# Patient Record
Sex: Male | Born: 1965 | Race: White | Hispanic: No | Marital: Single | Smoking: Never smoker
Health system: Southern US, Community
[De-identification: ages and names within clinical notes are randomized; demographics above are authoritative.]

## PROBLEM LIST (undated history)

## (undated) DIAGNOSIS — J9819 Other pulmonary collapse: Secondary | ICD-10-CM

## (undated) DIAGNOSIS — IMO0002 Reserved for concepts with insufficient information to code with codable children: Secondary | ICD-10-CM

## (undated) DIAGNOSIS — K219 Gastro-esophageal reflux disease without esophagitis: Secondary | ICD-10-CM

## (undated) DIAGNOSIS — J302 Other seasonal allergic rhinitis: Secondary | ICD-10-CM

## (undated) DIAGNOSIS — N209 Urinary calculus, unspecified: Secondary | ICD-10-CM

## (undated) DIAGNOSIS — M1990 Unspecified osteoarthritis, unspecified site: Secondary | ICD-10-CM

## (undated) DIAGNOSIS — E669 Obesity, unspecified: Secondary | ICD-10-CM

## (undated) DIAGNOSIS — R112 Nausea with vomiting, unspecified: Secondary | ICD-10-CM

## (undated) DIAGNOSIS — Z9889 Other specified postprocedural states: Secondary | ICD-10-CM

## (undated) DIAGNOSIS — Z87442 Personal history of urinary calculi: Secondary | ICD-10-CM

## (undated) DIAGNOSIS — R51 Headache: Secondary | ICD-10-CM

## (undated) DIAGNOSIS — Z0000 Encounter for general adult medical examination without abnormal findings: Secondary | ICD-10-CM

## (undated) DIAGNOSIS — E119 Type 2 diabetes mellitus without complications: Secondary | ICD-10-CM

## (undated) DIAGNOSIS — R3129 Other microscopic hematuria: Secondary | ICD-10-CM

## (undated) DIAGNOSIS — M109 Gout, unspecified: Secondary | ICD-10-CM

## (undated) HISTORY — DX: Obesity, unspecified: E66.9

## (undated) HISTORY — DX: Type 2 diabetes mellitus without complications: E11.9

## (undated) HISTORY — DX: Gout, unspecified: M10.9

## (undated) HISTORY — DX: Headache: R51

## (undated) HISTORY — DX: Reserved for concepts with insufficient information to code with codable children: IMO0002

## (undated) HISTORY — DX: Other microscopic hematuria: R31.29

## (undated) HISTORY — DX: Encounter for general adult medical examination without abnormal findings: Z00.00

## (undated) HISTORY — PX: COLONOSCOPY: SHX174

## (undated) HISTORY — DX: Urinary calculus, unspecified: N20.9

## (undated) HISTORY — PX: CHEST TUBE INSERTION: SHX231

## (undated) HISTORY — DX: Other seasonal allergic rhinitis: J30.2

## (undated) HISTORY — PX: WISDOM TOOTH EXTRACTION: SHX21

---

## 1991-02-05 HISTORY — PX: KNEE SURGERY: SHX244

## 1997-07-15 ENCOUNTER — Ambulatory Visit: Admission: RE | Admit: 1997-07-15 | Discharge: 1997-07-15 | Payer: Self-pay | Admitting: Family Medicine

## 2010-02-04 HISTORY — PX: ELBOW SURGERY: SHX618

## 2010-07-03 ENCOUNTER — Ambulatory Visit (INDEPENDENT_AMBULATORY_CARE_PROVIDER_SITE_OTHER): Payer: BC Managed Care – PPO | Admitting: Internal Medicine

## 2010-07-03 ENCOUNTER — Encounter: Payer: Self-pay | Admitting: Internal Medicine

## 2010-07-03 DIAGNOSIS — E119 Type 2 diabetes mellitus without complications: Secondary | ICD-10-CM

## 2010-07-03 LAB — CBC WITH DIFFERENTIAL/PLATELET
Eosinophils Absolute: 0.2 10*3/uL (ref 0.0–0.7)
Eosinophils Relative: 2.7 % (ref 0.0–5.0)
HCT: 47 % (ref 39.0–52.0)
Lymphs Abs: 3.3 10*3/uL (ref 0.7–4.0)
MCHC: 34.4 g/dL (ref 30.0–36.0)
MCV: 95.2 fl (ref 78.0–100.0)
Monocytes Absolute: 0.5 10*3/uL (ref 0.1–1.0)
Neutrophils Relative %: 55.3 % (ref 43.0–77.0)
Platelets: 251 10*3/uL (ref 150.0–400.0)
RDW: 13.5 % (ref 11.5–14.6)
WBC: 9.1 10*3/uL (ref 4.5–10.5)

## 2010-07-03 LAB — BASIC METABOLIC PANEL
BUN: 14 mg/dL (ref 6–23)
CO2: 29 mEq/L (ref 19–32)
Chloride: 104 mEq/L (ref 96–112)
Creatinine, Ser: 1 mg/dL (ref 0.4–1.5)
Glucose, Bld: 189 mg/dL — ABNORMAL HIGH (ref 70–99)
Potassium: 4.4 mEq/L (ref 3.5–5.1)

## 2010-07-03 LAB — TSH: TSH: 0.59 u[IU]/mL (ref 0.35–5.50)

## 2010-07-03 MED ORDER — GLUCOSE BLOOD VI STRP: ORAL_STRIP | Status: AC

## 2010-07-03 MED ORDER — ONETOUCH ULTRASOFT LANCETS MISC: Status: AC

## 2010-07-03 NOTE — Progress Notes (Signed)
  Subjective:    Patient ID: Nathaniel Luna, male    DOB: 28-Aug-1965, 45 y.o.   MRN: [id number]  HPI New patient, CC is diabetes. 8 years ago, he was told he had borderline diabetes, he  improve his lifestyle and  The DM numbers went back to normal. 4 years ago,  Test showed diabetes again, he lost   followup w/ his previous PCP. 6 weeks ago, he decided to start a new diet, has lost 25 pounds in the last 6 weeks. Last week, he had biometrics done at work, they recommended a home hemoglobin A1c test and it came back 9.0. He is here for confirmation of diabetes and management.  Past Medical History  Diagnosis Date  . Asthma     ? of asthma   . Headache     intense on-off, ibuprofen helps   . Seasonal allergies   . Hearing loss aprox. 2007    low tone decreased , R side, w/u neg per ENT  . Diabetes mellitus   . ACL (anterior cruciate ligament) tear 1992    question of    Past Surgical History  Procedure Date  . Knee surgery 93    carthilage  repair    Family History  Problem Relation Age of Onset  . Arthritis    . Breast cancer Mother   . Prostate cancer Father   . Stroke Mother     M on coumadin  . Aneurysm Father     AAA  . Aneurysm      GF-AAA  . Colon cancer Neg Hx    History   Social History  . Marital Status: Single    Spouse Name: N/A    Number of Children: 0  . Years of Education: N/A   Occupational History  . computer programer     Social History Main Topics  . Smoking status: Never Smoker   . Smokeless tobacco: Not on file  . Alcohol Use: Yes     socially   . Drug Use: No  . Sexually Active: Not on file   Other Topics Concern  . Not on file   Social History Narrative   Pt is a Jehovah Witness, WON'T TAKE TRANSFUSSIONS     Review of Systems Feels well No chest pain or shortness of breath No nausea, vomiting, diarrhea Denies any increased first or urination from baseline    Objective:   Physical Exam  Constitutional: He is oriented to  person, place, and time. He appears well-developed.       Overweight appearing  HENT:  Head: Normocephalic and atraumatic.  Neck: No thyromegaly present.  Cardiovascular: Normal rate, regular rhythm and normal heart sounds.   No murmur heard. Pulmonary/Chest: Effort normal and breath sounds normal. No respiratory distress. He has no wheezes. He has no rales.  Abdominal: Soft. He exhibits no distension. There is no tenderness. There is no rebound.  Musculoskeletal: He exhibits no edema.  Neurological: He is alert and oriented to person, place, and time.          Assessment & Plan:

## 2010-07-03 NOTE — Assessment & Plan Note (Addendum)
See history of present illness, based on the history and recent hemoglobin A1c of 9.0 the patient has diabetes. CBG today around 180. I discussed with patient the diagnosis,  What  the A1c means, diet and exercise. We provided a glucometer and CBG goals discussed. Refer to a nutritionist. Exercise at least 3 hours a week. Labs Come back in 6 weeks. meds if A1C > 9.0, otherwise pt will try diet -exercise

## 2010-07-06 ENCOUNTER — Telehealth: Payer: Self-pay | Admitting: *Deleted

## 2010-07-06 NOTE — Telephone Encounter (Signed)
Message left for patient to return my call.  

## 2010-07-06 NOTE — Telephone Encounter (Signed)
Pt aware of labs and copy mailed.

## 2010-07-06 NOTE — Telephone Encounter (Signed)
Message copied by Leanne Lovely on Fri Jul 06, 2010  9:49 AM ------      Message from: Willow Ora E      Created: Thu Jul 05, 2010  6:27 PM       Advise patient:      His A1c is 9.7, other labs normal.      Recommend to start metformin 500 mg:      Week #1: One tablet daily      Week #2 : One tablet twice a day      Week #3: 1.5 tablets twice a day he understands the goals.      If he develops nausea or diarrhea, he can decrease the dose of metformin and try again a few days later

## 2010-08-14 ENCOUNTER — Encounter: Payer: Self-pay | Admitting: Internal Medicine

## 2010-08-14 ENCOUNTER — Ambulatory Visit (INDEPENDENT_AMBULATORY_CARE_PROVIDER_SITE_OTHER): Payer: BC Managed Care – PPO | Admitting: Internal Medicine

## 2010-08-14 DIAGNOSIS — E119 Type 2 diabetes mellitus without complications: Secondary | ICD-10-CM

## 2010-08-14 MED ORDER — METFORMIN HCL 500 MG PO TABS: ORAL_TABLET | ORAL | Status: DC

## 2010-08-14 NOTE — Assessment & Plan Note (Addendum)
Last A1C 9.7, since then he is doing great w/ life style, has lost additional 15 lb. Decided not to take meds D/w pt DM II mechanism and benefits of taking metformin, I think he will benefit from it Plan: Metformin Cont w/  Healthy life style! See instructions Re-referr to a nutritionist

## 2010-08-14 NOTE — Progress Notes (Signed)
  Subjective:    Patient ID: Nathaniel Luna, male    DOB: 1965-09-26, 45 y.o.   MRN: [id number]  HPI Followup from previous visit. A1c was 9.7, he declined to take metformin as recommended. He is doing a great job with diet and exercise, has decreased 17 additional pounds. Ambulatory blood sugars varies from 140-170. Very rarely are  more than 200. He did not hear about a nutritionist referral  Past Medical History  Diagnosis Date  . Asthma     ? of asthma   . Headache     intense on-off, ibuprofen helps   . Seasonal allergies   . Hearing loss aprox. 2007    low tone decreased , R side, w/u neg per ENT  . Diabetes mellitus   . ACL (anterior cruciate ligament) tear 1992    question of     Past Surgical History  Procedure Date  . Knee surgery 93    carthilage  repair      Review of Systems     Objective:   Physical Exam Alert, oriented, in no apparent distress.       Assessment & Plan:  Today , I spent more than 15 min with the patient, >50% of the time counseling about mechanism of diabetes type 2, benefits of medications such  metformin.

## 2010-08-14 NOTE — Patient Instructions (Signed)
Start metformin 500 mg: Week #1: One tablet daily Week #2 : One tablet twice a day Week #3: 1.5 tablets twice a day  Reduce dose if side effcects SCHEDULE A LAB VISIT IN 2 MONTHS: A1C, BMP, AST,ALT-----DX DM SEE ME IN 4 MONTHS

## 2010-10-03 ENCOUNTER — Ambulatory Visit: Payer: BC Managed Care – PPO | Admitting: *Deleted

## 2010-10-15 ENCOUNTER — Other Ambulatory Visit: Payer: Self-pay | Admitting: Internal Medicine

## 2010-10-15 DIAGNOSIS — E119 Type 2 diabetes mellitus without complications: Secondary | ICD-10-CM

## 2010-10-16 ENCOUNTER — Other Ambulatory Visit (INDEPENDENT_AMBULATORY_CARE_PROVIDER_SITE_OTHER): Payer: BC Managed Care – PPO

## 2010-10-16 DIAGNOSIS — E119 Type 2 diabetes mellitus without complications: Secondary | ICD-10-CM

## 2010-10-16 NOTE — Progress Notes (Signed)
Labs only

## 2010-10-17 LAB — BASIC METABOLIC PANEL
BUN: 14 mg/dL (ref 6–23)
Calcium: 9.2 mg/dL (ref 8.4–10.5)
GFR: 102.89 mL/min (ref >60.00)
Potassium: 4.5 mEq/L (ref 3.5–5.1)
Sodium: 142 mEq/L (ref 135–145)

## 2010-10-17 LAB — AST: AST: 20 U/L (ref 0–37)

## 2010-10-18 ENCOUNTER — Telehealth: Payer: Self-pay

## 2010-10-18 NOTE — Telephone Encounter (Signed)
Message copied by Beverely Low on Thu Oct 18, 2010  4:54 PM ------      Message from: Willow Ora E      Created: Thu Oct 18, 2010  4:52 PM       Advise patient:      His diabetes has improved significantly, very good results, continue with same medications and followup as recommended (by November)

## 2010-10-18 NOTE — Telephone Encounter (Signed)
Left message to notify pt of lab results 

## 2010-12-04 ENCOUNTER — Encounter: Payer: Self-pay | Admitting: Internal Medicine

## 2010-12-04 ENCOUNTER — Ambulatory Visit (INDEPENDENT_AMBULATORY_CARE_PROVIDER_SITE_OTHER): Payer: BC Managed Care – PPO | Admitting: Internal Medicine

## 2010-12-04 DIAGNOSIS — E119 Type 2 diabetes mellitus without complications: Secondary | ICD-10-CM

## 2010-12-04 NOTE — Assessment & Plan Note (Signed)
Doing great, we discussed his A1Cs Encouraged to cont meds and life style modicfication (he thinks is sustainable!) Will RTC for a CPX

## 2010-12-04 NOTE — Patient Instructions (Signed)
Please schedule your CPX

## 2010-12-04 NOTE — Progress Notes (Signed)
  Subjective:    Patient ID: Nathaniel Luna, male    DOB: [birth year], 45 y.o.   MRN: [id number]  HPI ROV Doing great  Past Medical History  Diagnosis Date  . Asthma     ? of asthma   . Headache     intense on-off, ibuprofen helps   . Seasonal allergies   . Hearing loss aprox. 2007    low tone decreased , R side, w/u neg per ENT  . Diabetes mellitus   . ACL (anterior cruciate ligament) tear 1992    question of    Past Surgical History  Procedure Date  . Knee surgery 93    carthilage  repair      Review of Systems Cont with his healthier life style, has lost (per our scales ) 13 pounds in 3 months  good medication compliance and no s/e from meds  No N-V-D    Objective:   Physical Exam  Constitutional: He appears well-developed. No distress.  Cardiovascular: Normal rate, regular rhythm and normal heart sounds.   No murmur heard. Pulmonary/Chest: Effort normal and breath sounds normal. No respiratory distress. He has no wheezes. He has no rales.  Musculoskeletal: He exhibits no edema.  Skin: He is not diaphoretic.          Assessment & Plan:  Declined a flu shot, explained benefits

## 2010-12-25 ENCOUNTER — Encounter: Payer: Self-pay | Admitting: Internal Medicine

## 2010-12-25 ENCOUNTER — Ambulatory Visit (INDEPENDENT_AMBULATORY_CARE_PROVIDER_SITE_OTHER): Payer: BC Managed Care – PPO | Admitting: Internal Medicine

## 2010-12-25 DIAGNOSIS — Z0000 Encounter for general adult medical examination without abnormal findings: Secondary | ICD-10-CM

## 2010-12-25 DIAGNOSIS — Z5181 Encounter for therapeutic drug level monitoring: Secondary | ICD-10-CM

## 2010-12-25 HISTORY — DX: Encounter for general adult medical examination without abnormal findings: Z00.00

## 2010-12-25 LAB — LIPID PANEL
HDL: 41.1 mg/dL (ref >39.00)
Total CHOL/HDL Ratio: 4
VLDL: 19.4 mg/dL (ref 0.0–40.0)

## 2010-12-25 NOTE — Progress Notes (Signed)
  Subjective:    Patient ID: Nathaniel Luna, male    DOB: [birth year], 45 y.o.   MRN: [id number]  HPI Complete physical exam  Past Medical History  Diagnosis Date  . Asthma     ? of asthma   . Headache     intense on-off, ibuprofen helps   . Seasonal allergies   . Hearing loss aprox. 2007    low tone decreased , R side, w/u neg per ENT  . Diabetes mellitus   . ACL (anterior cruciate ligament) tear 1992    question of    Past Surgical History  Procedure Date  . Knee surgery 93    carthilage  repair post injury , R   History   Social History  . Marital Status: Single    Spouse Name: N/A    Number of Children: 0  . Years of Education: N/A   Occupational History  . computer programer     Social History Main Topics  . Smoking status: Never Smoker   . Smokeless tobacco: Never Used  . Alcohol Use: Yes     socially   . Drug Use: No  . Sexually Active: Not on file   Other Topics Concern  . Not on file   Social History Narrative   Pt is a Jehovah Witness, WON'T TAKE TRANSFUSSIONS---Lives by himself---Diet: no change lately , healthier than previous years---Exercise: 1-2 per week   Family History  Problem Relation Age of Onset  . Rheum arthritis Father   . Breast cancer Mother   . Prostate cancer Father 64  . Stroke Mother     M on coumadin  . Aneurysm Father     AAA father dx age 32 aproxand GF  . Colon cancer Neg Hx   . Colon polyps Father     dx in his 24s  . Coronary artery disease Neg Hx   . Diabetes      GF?     Review of Systems No chest pain or shortness of breath No nausea, vomiting, diarrhea or blood in the stools No difficulty urinating or blood in the urine     Objective:   Physical Exam  Constitutional: He is oriented to person, place, and time. He appears well-developed. No distress.  Neck: No thyromegaly present.  Cardiovascular: Normal rate, regular rhythm and normal heart sounds.   No murmur heard. Pulmonary/Chest: Effort normal and  breath sounds normal. No respiratory distress. He has no wheezes. He has no rales.  Abdominal: Soft. Bowel sounds are normal. He exhibits no distension. There is no tenderness. There is no rebound and no guarding.  Musculoskeletal: He exhibits no edema.  Neurological: He is alert and oriented to person, place, and time.  Skin: He is not diaphoretic.  Psychiatric: He has a normal mood and affect. His behavior is normal. Judgment and thought content normal.      Assessment & Plan:

## 2010-12-25 NOTE — Patient Instructions (Addendum)
Call for a lab appointment by the end of December: A1C dx diabetes Diet! Exercise!  next OV in 4 months

## 2010-12-25 NOTE — Assessment & Plan Note (Addendum)
Td 2009 per chart review  Hep B shots 2009 per chart review Flu shot declined, aware of benefits  Never had a cscope , FH colon polyps , discussed cscope vs iFOB. Will do an iFOB this year EKG today wnl Diet exercise discussed  Addendum: He also complained of a paresthesia and a left hand, fourth and fifth finger. Symptoms triggered by putting pressure on the inner elbow. 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

## 2011-01-21 ENCOUNTER — Other Ambulatory Visit: Payer: BC Managed Care – PPO

## 2011-01-21 ENCOUNTER — Other Ambulatory Visit: Payer: Self-pay | Admitting: Internal Medicine

## 2011-01-21 DIAGNOSIS — Z1211 Encounter for screening for malignant neoplasm of colon: Secondary | ICD-10-CM

## 2011-01-21 LAB — FECAL OCCULT BLOOD, IMMUNOCHEMICAL: Fecal Occult Bld: NEGATIVE

## 2011-01-22 ENCOUNTER — Other Ambulatory Visit: Payer: Self-pay | Admitting: Internal Medicine

## 2011-01-22 DIAGNOSIS — E119 Type 2 diabetes mellitus without complications: Secondary | ICD-10-CM

## 2011-01-23 ENCOUNTER — Other Ambulatory Visit (INDEPENDENT_AMBULATORY_CARE_PROVIDER_SITE_OTHER): Payer: BC Managed Care – PPO

## 2011-01-23 ENCOUNTER — Telehealth: Payer: Self-pay | Admitting: Internal Medicine

## 2011-01-23 DIAGNOSIS — G5600 Carpal tunnel syndrome, unspecified upper limb: Secondary | ICD-10-CM

## 2011-01-23 DIAGNOSIS — E119 Type 2 diabetes mellitus without complications: Secondary | ICD-10-CM

## 2011-01-23 LAB — LIPID PANEL
HDL: 38.6 mg/dL — ABNORMAL LOW (ref >39.00)
LDL Cholesterol: 92 mg/dL (ref 0–99)
Total CHOL/HDL Ratio: 4
Triglycerides: 119 mg/dL (ref 0.0–149.0)

## 2011-01-23 LAB — HEMOGLOBIN A1C: Hgb A1c MFr Bld: 6.2 % (ref 4.6–6.5)

## 2011-01-23 NOTE — Telephone Encounter (Signed)
PATIENT WALKED INTO OFFICE THIS MORNING, STATES HE WAS SUPPOSED TO BE REFERRED TO HAND SURGEON FOR CARPAL TUNNEL.  THERE IS NO REFERRAL FOR THIS, WILL YOU PLEASE ENTER?

## 2011-01-23 NOTE — Telephone Encounter (Signed)
See last OV, referral entered

## 2011-01-24 ENCOUNTER — Encounter: Payer: Self-pay | Admitting: *Deleted

## 2011-01-24 ENCOUNTER — Other Ambulatory Visit: Payer: BC Managed Care – PPO

## 2011-01-28 ENCOUNTER — Encounter: Payer: Self-pay | Admitting: *Deleted

## 2011-03-05 ENCOUNTER — Telehealth: Payer: Self-pay | Admitting: Internal Medicine

## 2011-03-05 MED ORDER — METFORMIN HCL 500 MG PO TABS: ORAL_TABLET | ORAL | Status: DC

## 2011-03-05 NOTE — Telephone Encounter (Signed)
Refill done.  

## 2011-03-05 NOTE — Telephone Encounter (Signed)
Patient states that [place] is going out of the [place] for a [place] and needs a refill of metformin sent to [facility] in [place] on [address]

## 2011-04-23 ENCOUNTER — Ambulatory Visit (INDEPENDENT_AMBULATORY_CARE_PROVIDER_SITE_OTHER): Payer: BC Managed Care – PPO | Admitting: Internal Medicine

## 2011-04-23 ENCOUNTER — Encounter: Payer: Self-pay | Admitting: Internal Medicine

## 2011-04-23 DIAGNOSIS — E119 Type 2 diabetes mellitus without complications: Secondary | ICD-10-CM

## 2011-04-23 LAB — BASIC METABOLIC PANEL
BUN: 14 mg/dL (ref 6–23)
CO2: 28 mEq/L (ref 19–32)
Calcium: 9.1 mg/dL (ref 8.4–10.5)
Chloride: 105 mEq/L (ref 96–112)
Creatinine, Ser: 0.8 mg/dL (ref 0.4–1.5)
GFR: 111.71 mL/min (ref >60.00)
Glucose, Bld: 119 mg/dL — ABNORMAL HIGH (ref 70–99)
Potassium: 3.5 mEq/L (ref 3.5–5.1)
Sodium: 142 mEq/L (ref 135–145)

## 2011-04-23 LAB — ALT: ALT: 25 U/L (ref 0–53)

## 2011-04-23 LAB — AST: AST: 19 U/L (ref 0–37)

## 2011-04-23 NOTE — Assessment & Plan Note (Signed)
Normal feet exam see instructions. Labs. Last hemoglobin A1c good and stable

## 2011-04-23 NOTE — Patient Instructions (Signed)
Diabetics: Remember that you need your eyes checked at least once a year to be sure you don't have "retinopathy" As a diabetic patient, you need to be seen every 3 to 6 months on average, keeping  your followups is extremely important to be sure your sugar is well-controlled and you avoid long term complications. Read the note about feet care  --------------------------------------------------------------------------- Schedule an appointment to discuss immunizations

## 2011-04-23 NOTE — Assessment & Plan Note (Signed)
moving to [place]? Will make an appointment to talk about immunizations specifically.

## 2011-04-23 NOTE — Progress Notes (Signed)
  Subjective:    Patient ID: Nathaniel Luna, male    DOB: 01/25/1966, 46 y.o.   MRN: [id number]  HPI ROV Immunizations? Moving to [place] soon, needs to discuss immunization at a future appointment DM-- f/u, good  medication compliance, no recent ambulatory blood sugars.  Past Medical History  Diagnosis Date  . Asthma     ? of asthma   . Headache     intense on-off, ibuprofen helps   . Seasonal allergies   . Hearing loss aprox. 2007    low tone decreased , R side, w/u neg per ENT  . Diabetes mellitus   . ACL (anterior cruciate ligament) tear 1992    question of    SH Moving to [place]?   Review of Systems No chest pain or shortness of breath No nausea, vomiting, diarrhea    Objective:   Physical Exam  Alert oriented x3, no apparent distress. DIABETIC FEET EXAM: No lower extremity edema Normal pedal pulses bilaterally Skin normal and nails are thick Pinprick examination of the feet normal.     Assessment & Plan:

## 2011-04-26 ENCOUNTER — Encounter: Payer: Self-pay | Admitting: Internal Medicine

## 2011-05-07 ENCOUNTER — Ambulatory Visit (INDEPENDENT_AMBULATORY_CARE_PROVIDER_SITE_OTHER): Payer: BC Managed Care – PPO | Admitting: *Deleted

## 2011-05-07 DIAGNOSIS — Z23 Encounter for immunization: Secondary | ICD-10-CM

## 2011-05-10 ENCOUNTER — Telehealth: Payer: Self-pay | Admitting: Internal Medicine

## 2011-05-10 DIAGNOSIS — Z136 Encounter for screening for cardiovascular disorders: Secondary | ICD-10-CM

## 2011-05-10 NOTE — Telephone Encounter (Signed)
Patient called & is requesting referrals for the following AAA SCAN & Colonoscopy  Patient states he has a very high deductible which he has met & it will re-start 5.1.2013 Patient would like to have these things done before then  Patient ph# [phone],  He would like wt Dr Fabienne Bruns

## 2011-05-10 NOTE — Telephone Encounter (Signed)
Patient called back & wants to be referred to Dr Christella Hartigan for colonoscopy

## 2011-05-10 NOTE — Telephone Encounter (Signed)
OK to refer.

## 2011-05-13 NOTE — Telephone Encounter (Signed)
Done

## 2011-05-13 NOTE — Telephone Encounter (Signed)
Arrange a Cscope w/ Dr Christella Hartigan Arrange a Aorta U/S dx screening for AAA, made pt aware if may not be covered by his insurance, he may like to contact them before the procedure

## 2011-05-15 ENCOUNTER — Encounter: Payer: Self-pay | Admitting: Gastroenterology

## 2011-05-16 ENCOUNTER — Ambulatory Visit (INDEPENDENT_AMBULATORY_CARE_PROVIDER_SITE_OTHER): Payer: BC Managed Care – PPO | Admitting: *Deleted

## 2011-05-16 DIAGNOSIS — Z136 Encounter for screening for cardiovascular disorders: Secondary | ICD-10-CM

## 2011-05-20 ENCOUNTER — Ambulatory Visit (INDEPENDENT_AMBULATORY_CARE_PROVIDER_SITE_OTHER): Payer: BC Managed Care – PPO | Admitting: Internal Medicine

## 2011-05-20 ENCOUNTER — Ambulatory Visit (AMBULATORY_SURGERY_CENTER): Payer: BC Managed Care – PPO | Admitting: *Deleted

## 2011-05-20 DIAGNOSIS — Z0000 Encounter for general adult medical examination without abnormal findings: Secondary | ICD-10-CM

## 2011-05-20 NOTE — Progress Notes (Signed)
Pt here for previsit for screening colonoscopy. He states he scheduled procedure as his deductible is met this year and moving to [place] 06/2011. States he is not having any GI problems at this time. No family history of colon cancer or adenomatous polyps. Spoke with Dr. Christella Hartigan who states patient is not due for colonoscopy at this time. Pt states understanding and will present for screening colonoscopy at age 46 or if GI complaints arise.

## 2011-05-21 ENCOUNTER — Ambulatory Visit (INDEPENDENT_AMBULATORY_CARE_PROVIDER_SITE_OTHER): Payer: BC Managed Care – PPO | Admitting: Internal Medicine

## 2011-05-21 ENCOUNTER — Encounter: Payer: Self-pay | Admitting: Internal Medicine

## 2011-05-21 VITALS — BP 132/84 | HR 82 | Temp 97.0°F | Wt 301.0 lb

## 2011-05-21 DIAGNOSIS — Z23 Encounter for immunization: Secondary | ICD-10-CM

## 2011-05-21 DIAGNOSIS — E119 Type 2 diabetes mellitus without complications: Secondary | ICD-10-CM

## 2011-05-21 DIAGNOSIS — Z7189 Other specified counseling: Secondary | ICD-10-CM

## 2011-05-21 DIAGNOSIS — IMO0002 Reserved for concepts with insufficient information to code with codable children: Secondary | ICD-10-CM | POA: Insufficient documentation

## 2011-05-21 HISTORY — DX: Reserved for concepts with insufficient information to code with codable children: IMO0002

## 2011-05-21 NOTE — Assessment & Plan Note (Signed)
See above.  I am dictating a letter in [place] and Spanish that summarize the patient's medical problems.

## 2011-05-21 NOTE — Assessment & Plan Note (Signed)
No change 

## 2011-05-21 NOTE — Patient Instructions (Signed)
You next  a typhoid vaccination booster is next year Your next hepatitis A shot is in 6 months. Good luck!

## 2011-05-21 NOTE — Progress Notes (Signed)
  Subjective:    Patient ID: Nathaniel Luna, male    DOB: 1965-08-08, 46 y.o.   MRN: [id number]  HPI Routine visit. He is here to discuss all his medical problems before he leaves to [place]. In general feeling well.  Past Medical History  Diagnosis Date  . Asthma     ? of asthma   . Headache     intense on-off, ibuprofen helps   . Seasonal allergies   . Hearing loss aprox. 2007    low tone decreased , R side, w/u neg per ENT  . Diabetes mellitus   . ACL (anterior cruciate ligament) tear 1992    question of       Review of Systems Feeling well     Objective:   Physical Exam  A, ox3, nad       Assessment & Plan:   All previous labs were discussed with the patient. He reports that he is updated on all his routine shots. We'll provide a hepatitis A shot today, and needs another one in 6 months Reports that his hepatitis B. series is completed (was done elsewhere) I reminded the patient that malaria and rabies exist  in [place], extreme caution is recommended, to see a local doctor if he is to go to an area of malaria. He reports a typhoid immunization 4 years ago, needs a booster next year. A letter in  [place] and Spanish will be provided to the patient summarizing his medical history.  Today , I spent more than 25  min with the patient, >50% of the time counseling, and /or reviewing the chart and labs

## 2011-05-28 NOTE — Procedures (Unsigned)
DUPLEX ULTRASOUND OF ABDOMINAL AORTA  INDICATION:  Screening for abdominal aortic aneurysm  HISTORY: Diabetes:  Yes Cardiac:  No Hypertension:  No Smoking:  No Connective Tissue Disorder: Family History:  Yes Previous Surgery:  No  DUPLEX EXAM:         AP (cm)                   TRANSVERSE (cm) Proximal             2.9 cm                    2.9 cm Mid                  2.2 cm                    2.2 cm Distal               1.9 cm                    2.0 cm Right Iliac          1.4 cm                    1.3 cm Left Iliac           1.2 cm                    1.3 cm  PREVIOUS:  Date:  AP:  TRANSVERSE:  IMPRESSION: 1. No evidence of aneurysmal dilatation noted in the abdominal aorta. 2. Mildly decreased visualization of the aortoiliac system due to     patient body habitus and overlying bowel gas.  ___________________________________________ V. Charlena Cross, MD  CH/MEDQ  D:  05/17/2011  T:  05/17/2011  Job:  [id number]

## 2011-05-31 ENCOUNTER — Other Ambulatory Visit: Payer: BC Managed Care – PPO | Admitting: Gastroenterology

## 2011-06-03 ENCOUNTER — Telehealth: Payer: Self-pay | Admitting: *Deleted

## 2011-06-03 MED ORDER — METFORMIN HCL 500 MG PO TABS: ORAL_TABLET | ORAL | Status: DC

## 2011-06-03 NOTE — Telephone Encounter (Signed)
Refill done.  

## 2011-06-06 NOTE — Progress Notes (Signed)
  Subjective:    Patient ID: Nathaniel Luna, male    DOB: [birth year], 46 y.o.   MRN: [id number]  HPI No show   Review of Systems     Objective:   Physical Exam        Assessment & Plan:

## 2011-06-20 ENCOUNTER — Encounter: Payer: BC Managed Care – PPO | Admitting: Vascular Surgery

## 2011-07-02 ENCOUNTER — Other Ambulatory Visit: Payer: BC Managed Care – PPO | Admitting: Gastroenterology

## 2013-06-04 ENCOUNTER — Telehealth: Payer: Self-pay

## 2013-06-04 NOTE — Telephone Encounter (Signed)
Medication List and allergies:  Reviewed and updated  90 day supply/mail order: na Local prescriptions: na---get OTC in [place]  Immunizations due: Tdap  A/P:   Updated FH, PSH and Personal Hx  To Discuss with Provider: Not at this time

## 2013-06-07 ENCOUNTER — Encounter: Payer: Self-pay | Admitting: Internal Medicine

## 2013-06-07 ENCOUNTER — Ambulatory Visit (INDEPENDENT_AMBULATORY_CARE_PROVIDER_SITE_OTHER): Payer: BC Managed Care – PPO | Admitting: Internal Medicine

## 2013-06-07 VITALS — BP 126/81 | HR 90 | Temp 98.2°F | Ht 71.4 in | Wt 310.0 lb

## 2013-06-07 DIAGNOSIS — Z0000 Encounter for general adult medical examination without abnormal findings: Secondary | ICD-10-CM

## 2013-06-07 DIAGNOSIS — E119 Type 2 diabetes mellitus without complications: Secondary | ICD-10-CM

## 2013-06-07 NOTE — Assessment & Plan Note (Addendum)
Td 2009   Hep B shots 2009 per chart review Never had a cscope , FH colon polyps , before his insurance declined a cscope, declined to try again Labs Diet and exercise discussed, see diabetes Family history of AAA, patient had ultrasound 05-2011 which was negative for AAA

## 2013-06-07 NOTE — Progress Notes (Signed)
Subjective:    Patient ID: Nathaniel Luna, male    DOB: [birth year], 48 y.o.   MRN: [id number]  DOS:  06/07/2013 Type of  visit:  CPX, pt lives in [place]  ROS Diet-- eat [place] food "I can't tell if is healthy" Exercise-- walks daily  No  CP, SOB Denies  nausea, vomiting diarrhea  Denies  blood in the stools (-) cough, sputum production (-) wheezing, chest congestion No dysuria, gross hematuria, difficulty urinating  No anxiety, depression    Past Medical History  Diagnosis Date  . Asthma     ? of asthma   . Headache(784.0)     intense on-off, ibuprofen helps   . Seasonal allergies   . Hearing loss aprox. 2007    low tone decreased , R side, w/u neg per ENT  . Diabetes mellitus     Past Surgical History  Procedure Laterality Date  . Knee surgery  93    carthilage  repair post injury , R  . Elbow surgery Left 2012    cubital tunnel syndrome     History   Social History  . Marital Status: Single    Spouse Name: N/A    Number of Children: 0  . Years of Education: N/A   Occupational History  . computer programer     Social History Main Topics  . Smoking status: Never Smoker   . Smokeless tobacco: Never Used  . Alcohol Use: Yes     Comment: socially   . Drug Use: No  . Sexual Activity: Not on file   Other Topics Concern  . Not on file   Social History Narrative   Pt is a Fish farm managerJehovah Witness, WON'T TAKE TRANSFUSSIONS    Lives in [place]  Himself          Family History  Problem Relation Age of Onset  . Rheum arthritis Father   . Breast cancer Mother   . Prostate cancer Father [id number]  . Stroke Mother   . AAA (abdominal aortic aneurysm) Father     AAA father dx age 49 aproxand GF  . Colon cancer Neg Hx   . Colon polyps Father     dx in his 7150s  . Coronary artery disease Neg Hx   . Diabetes      GF?  Marland Kitchen. Dementia Mother   . AAA (abdominal aortic aneurysm) Sister        Medication List       This list is accurate as of: 06/07/13  9:00 PM.   Always use your most recent med list.               aspirin 325 MG tablet  Take 325 mg by mouth daily.     glucosamine-chondroitin 500-400 MG tablet  Take 1 tablet by mouth 2 (two) times daily.     metFORMIN 500 MG tablet  Commonly known as:  GLUCOPHAGE  1.5 TABLETS TWICE A DAY     multivitamin tablet  Take 1 tablet by mouth daily.     niacin 500 MG tablet  Take 500 mg by mouth daily with breakfast.           Objective:   Physical Exam BP 126/81  Pulse 90  Temp(Src) 98.2 F (36.8 C)  Ht 5' 11.4" (1.814 m)  Wt 310 lb (140.615 kg)  BMI 42.73 kg/m2  SpO2 95%  General -- alert, well-developed, NAD.  Neck --no thyromegaly   HEENT-- Not pale.  Lungs -- normal respiratory effort, no intercostal retractions, no accessory muscle use, and normal breath sounds.  Heart-- normal rate, regular rhythm, no murmur.  Abdomen-- Not distended, good bowel sounds,soft, non-tender.  Extremities-- no pretibial edema bilaterally  Neurologic--  alert & oriented X3. Speech normal, gait normal, strength normal in all extremities.   Psych-- Cognition and judgment appear intact. Cooperative with normal attention span and concentration. No anxious or depressed appearing.       Assessment & Plan:

## 2013-06-07 NOTE — Progress Notes (Signed)
Pre visit review using our clinic review tool, if applicable. No additional management support is needed unless otherwise documented below in the visit note. 

## 2013-06-07 NOTE — Patient Instructions (Signed)
Please come back fasting:  CMP, CBC, TSH, FLP --- dx V70  A1C, microalbumin --- dx DM  Next visit depending on results, if  all normal-good:  one year

## 2013-06-07 NOTE — Assessment & Plan Note (Signed)
Currently living in [place],  has gained some weight, has difficulty understanding the  nutritional value of the food there. Encouraged to discuss diet w/ a local nutritionists He remains very active . Plan:  Check A1c Continue metformin Followup in one year, if he needs more medications he will have to be seen sooner, [place] usually [place] the [place] every 3 months. Also needs to consider getting a local M.D. In [place] Had eyes checked this AM

## 2013-06-08 ENCOUNTER — Other Ambulatory Visit: Payer: BC Managed Care – PPO

## 2013-06-08 LAB — COMPREHENSIVE METABOLIC PANEL
ALBUMIN: 3.8 g/dL (ref 3.5–5.2)
ALT: 37 U/L (ref 0–53)
AST: 23 U/L (ref 0–37)
Alkaline Phosphatase: 55 U/L (ref 39–117)
BUN: 14 mg/dL (ref 6–23)
CALCIUM: 9.1 mg/dL (ref 8.4–10.5)
CHLORIDE: 108 meq/L (ref 96–112)
CO2: 27 meq/L (ref 19–32)
CREATININE: 1 mg/dL (ref 0.4–1.5)
GFR: 83.38 mL/min (ref >60.00)
Glucose, Bld: 137 mg/dL — ABNORMAL HIGH (ref 70–99)
POTASSIUM: 3.9 meq/L (ref 3.5–5.1)
Sodium: 143 mEq/L (ref 135–145)
TOTAL PROTEIN: 7.3 g/dL (ref 6.0–8.3)
Total Bilirubin: 0.7 mg/dL (ref 0.2–1.2)

## 2013-06-08 LAB — CBC WITH DIFFERENTIAL/PLATELET
BASOS PCT: 0.6 % (ref 0.0–3.0)
Basophils Absolute: 0 10*3/uL (ref 0.0–0.1)
EOS PCT: 4.2 % (ref 0.0–5.0)
Eosinophils Absolute: 0.3 10*3/uL (ref 0.0–0.7)
HCT: 43 % (ref 39.0–52.0)
HEMOGLOBIN: 14.4 g/dL (ref 13.0–17.0)
LYMPHS ABS: 2.4 10*3/uL (ref 0.7–4.0)
Lymphocytes Relative: 30.9 % (ref 12.0–46.0)
MCHC: 33.6 g/dL (ref 30.0–36.0)
MCV: 95 fl (ref 78.0–100.0)
MONO ABS: 0.5 10*3/uL (ref 0.1–1.0)
Monocytes Relative: 6.3 % (ref 3.0–12.0)
NEUTROS ABS: 4.6 10*3/uL (ref 1.4–7.7)
Neutrophils Relative %: 58 % (ref 43.0–77.0)
PLATELETS: 247 10*3/uL (ref 150.0–400.0)
RBC: 4.53 Mil/uL (ref 4.22–5.81)
RDW: 13.5 % (ref 11.5–15.5)
WBC: 7.9 10*3/uL (ref 4.0–10.5)

## 2013-06-08 LAB — LIPID PANEL
CHOL/HDL RATIO: 4
Cholesterol: 142 mg/dL (ref 0–200)
HDL: 34.1 mg/dL — ABNORMAL LOW (ref >39.00)
LDL CALC: 90 mg/dL (ref 0–99)
Triglycerides: 91 mg/dL (ref 0.0–149.0)
VLDL: 18.2 mg/dL (ref 0.0–40.0)

## 2013-06-08 LAB — MICROALBUMIN / CREATININE URINE RATIO
Creatinine,U: 81.8 mg/dL
MICROALB UR: 0.5 mg/dL (ref 0.0–1.9)
Microalb Creat Ratio: 0.6 mg/g (ref 0.0–30.0)

## 2013-06-08 LAB — HEMOGLOBIN A1C: HEMOGLOBIN A1C: 6.8 % — AB (ref 4.6–6.5)

## 2013-06-08 LAB — TSH: TSH: 0.56 u[IU]/mL (ref 0.35–4.50)

## 2013-06-08 NOTE — Addendum Note (Signed)
Addended by: Silvio PateHOMPSON, Vana Arif D on: 06/08/2013 08:54 AM   Modules accepted: Orders

## 2014-06-01 ENCOUNTER — Telehealth: Payer: Self-pay | Admitting: Internal Medicine

## 2014-06-01 NOTE — Telephone Encounter (Signed)
Caller name:Leatham Andrei Relation to ZO:XWRUpt:self Call back number:[phone] Pharmacy:  Reason for call: pt now live in [place] but will be back in [place] the week of July 20-24th, pt would like to get an appt for his CPE. Ok to use to slots for appt?

## 2014-06-01 NOTE — Telephone Encounter (Signed)
Okay to schedule CPE during that time, if needed put 2-15 minute slots together.

## 2014-06-02 NOTE — Telephone Encounter (Signed)
Left message for pt to call and schedule cpe

## 2014-09-15 ENCOUNTER — Telehealth: Payer: Self-pay | Admitting: Internal Medicine

## 2014-09-15 NOTE — Telephone Encounter (Signed)
error 

## 2014-09-16 ENCOUNTER — Ambulatory Visit (INDEPENDENT_AMBULATORY_CARE_PROVIDER_SITE_OTHER): Payer: BLUE CROSS/BLUE SHIELD | Admitting: Internal Medicine

## 2014-09-16 ENCOUNTER — Encounter: Payer: Self-pay | Admitting: Internal Medicine

## 2014-09-16 VITALS — BP 124/86 | HR 94 | Temp 98.3°F | Ht 71.0 in | Wt 319.0 lb

## 2014-09-16 DIAGNOSIS — N200 Calculus of kidney: Secondary | ICD-10-CM

## 2014-09-16 DIAGNOSIS — Z23 Encounter for immunization: Secondary | ICD-10-CM | POA: Diagnosis not present

## 2014-09-16 DIAGNOSIS — E119 Type 2 diabetes mellitus without complications: Secondary | ICD-10-CM

## 2014-09-16 DIAGNOSIS — N209 Urinary calculus, unspecified: Secondary | ICD-10-CM | POA: Insufficient documentation

## 2014-09-16 DIAGNOSIS — Z0000 Encounter for general adult medical examination without abnormal findings: Secondary | ICD-10-CM

## 2014-09-16 DIAGNOSIS — E790 Hyperuricemia without signs of inflammatory arthritis and tophaceous disease: Secondary | ICD-10-CM

## 2014-09-16 NOTE — Assessment & Plan Note (Addendum)
  Had a episode of kidney stones while in [place] 09-2013 approximately, at the time uric acid was elevated and a urologist recommended allopurinol,rowatinex , and 2 OTC. Reports that ultrasound showed 3 or 4 small kidney stones at the time. On further questions, he sometimes has pain and inflammation on his feet, and toes. Gout episodes? Plan: Labs including uric acid. Recommend to see a local doctor [place] in 6 months to monitor his blood test d/t allopurinol use

## 2014-09-16 NOTE — Assessment & Plan Note (Addendum)
Td 2009   Hep B shots 2009 per chart review Pneumonia shot recommended Colon cancer screening: Never had a cscope , FH colon polyps-- rec Cscope, patient states he agrees, will be unable to do it here in [place] but is considering doing it in  [place]  Prostate cancer screening:  DRE normal today, check a PSA Family history of AAA, patient had ultrasound 05-2011 which was negative for AAA Diet and exercise discussed

## 2014-09-16 NOTE — Assessment & Plan Note (Addendum)
Last A1c 6.7, on metformin. Labs Recommend to see an eye doctor yearly  Recommend to see a local M.D. (he lives in [place])  in 4-6 months, pt reluctant, explained that w/ diabetes been seen once a year is not enough and he needs to be monitored closely

## 2014-09-16 NOTE — Patient Instructions (Addendum)
Please get your blood work done at [facility] facility: CMP, CBC, TSH,  PSA FLP, uric acid, HIV, B12, folic acid ---- dx  CPX A1c, microalbumin --- dx  diabetes  It is extremely important that you see  a doctor every 4- 6 months , come back here or see a doctor in [place]  Next visit for a physical in one year

## 2014-09-16 NOTE — Progress Notes (Signed)
Subjective:    Patient ID: Nathaniel Luna, male    DOB: 05-12-1965, 49 y.o.   MRN: [id number]  DOS:  09/16/2014 Type of visit - description : Complete physical exam Interval history: Patient lives in [place], visiting [place], requests a  physical exam. Was diagnosed with kidney stones several months ago.    Review of Systems  Constitutional: No fever. No chills. No unexplained wt changes. No unusual sweats  HEENT: No dental problems, no ear discharge, no facial swelling, no voice changes. No eye discharge, no eye  redness , no  intolerance to light   Respiratory: No wheezing , no  difficulty breathing. No cough , no mucus production  Cardiovascular: No CP, no leg swelling , no  Palpitations  GI: no nausea, no vomiting, no diarrhea , no  abdominal pain.  No blood in the stools. No dysphagia, no odynophagia    Endocrine: No polyphagia, no polyuria , no polydipsia  GU: No dysuria, gross hematuria, difficulty urinating. No urinary urgency, no frequency.  Musculoskeletal: Occasional pain and swelling but no TTP at the ankles and feet joints  Skin: No change in the color of the skin, palor , no  Rash  Allergic, immunologic: No environmental allergies , no  food allergies  Neurological: No dizziness no  syncope. No headaches. No diplopia, no slurred, no slurred speech, no motor deficits, no facial  Numbness  Hematological: No enlarged lymph nodes, no easy bruising , no unusual bleedings  Psychiatry: No suicidal ideas, no hallucinations, no beavior problems, no confusion.  No unusual/severe anxiety, no depression     Past Medical History  Diagnosis Date  . Asthma     ? of asthma   . Headache(784.0)     intense on-off, ibuprofen helps   . Seasonal allergies   . Hearing loss aprox. 2007    low tone decreased , R side, w/u neg per ENT  . Diabetes mellitus   . Urolithiasis     while in [place] 09-2013    Past Surgical History  Procedure Laterality Date  . Knee  surgery  93    carthilage  repair post injury , R  . Elbow surgery Left 2012    cubital tunnel syndrome   . Wisdom tooth extraction      Social History   Social History  . Marital Status: Single    Spouse Name: N/A  . Number of Children: 0  . Years of Education: N/A   Occupational History  . computer programer     Social History Main Topics  . Smoking status: Never Smoker   . Smokeless tobacco: Never Used  . Alcohol Use: Yes     Comment: socially   . Drug Use: No  . Sexual Activity: Not on file   Other Topics Concern  . Not on file   Social History Narrative   Pt is a Jehovah Witness, WON'T TAKE TRANSFUSSIONS    Lives in [place]  Employed by an [employer]   Emergency contact--    father [phone]   Work [phone]       Family History  Problem Relation Age of Onset  . Rheum arthritis Father   . Breast cancer Mother   . Prostate cancer Father 72  . Stroke Mother   . AAA (abdominal aortic aneurysm) Father     AAA father dx age 13 aproxand GF  . Colon cancer Neg Hx   . Colon polyps Father     dx  in his 48s  . Coronary artery disease Neg Hx   . Diabetes      GF?  Marland Kitchen Dementia Mother   . AAA (abdominal aortic aneurysm) Sister        Medication List       This list is accurate as of: 09/16/14 11:59 PM.  Always use your most recent med list.               allopurinol 300 MG tablet  Commonly known as:  ZYLOPRIM  Take 300 mg by mouth daily.     aspirin 325 MG tablet  Take 325 mg by mouth daily.     glucosamine-chondroitin 500-400 MG tablet  Take 1 tablet by mouth 2 (two) times daily.     metFORMIN 500 MG tablet  Commonly known as:  GLUCOPHAGE  1.5 TABLETS TWICE A DAY     multivitamin tablet  Take 1 tablet by mouth daily.     niacin 500 MG tablet  Take 500 mg by mouth daily with breakfast.           Objective:   Physical Exam BP 124/86 mmHg  Pulse 94  Temp(Src) 98.3 F (36.8 C) (Oral)  Ht 5\' 11"  (1.803 m)  Wt 319 lb (144.697  kg)  BMI 44.51 kg/m2  SpO2 98% General:   Well developed, well nourished . NAD.  Neck:  Full range of motion. Supple. No  thyromegaly , normal carotid pulse HEENT:  Normocephalic . Face symmetric, atraumatic Lungs:  CTA B Normal respiratory effort, no intercostal retractions, no accessory muscle use. Heart: RRR,  no murmur.  No pretibial edema bilaterally  Abdomen:  Not distended, soft, non-tender. No rebound or rigidity. No mass,organomegaly Rectal:  External abnormalities: none. Normal sphincter tone. No rectal masses or tenderness.  Stool brown  Prostate: Prostate gland firm and smooth, no enlargement, nodularity, tenderness, mass, asymmetry or induration.  Skin: Exposed areas without rash. Not pale. Not jaundice Neurologic:  alert & oriented X3.  Speech normal, gait appropriate for age and unassisted Strength symmetric and appropriate for age.  Psych: Cognition and judgment appear intact.  Cooperative with normal attention span and concentration.  Behavior appropriate. No anxious or depressed appearing.    Assessment & Plan:

## 2014-09-16 NOTE — Progress Notes (Signed)
Pre visit review using our clinic review tool, if applicable. No additional management support is needed unless otherwise documented below in the visit note. 

## 2014-09-18 NOTE — Assessment & Plan Note (Signed)
Had a episode of kidney stones while in [place] 09-2013 approximately, at the time uric acid was elevated and a urologist recommended allopurinol,rowatinex , and 2 OTC. Reports that ultrasound showed 3 or 4 small kidney stones at the time. On further questions, he sometimes has pain and inflammation on his feet, and toes. Gout episodes? Plan: Labs including uric acid. Recommend to see a local doctor [place] in 6 months to monitor his blood test d/t allopurinol use

## 2014-09-22 ENCOUNTER — Other Ambulatory Visit (INDEPENDENT_AMBULATORY_CARE_PROVIDER_SITE_OTHER): Payer: BLUE CROSS/BLUE SHIELD

## 2014-09-22 DIAGNOSIS — E790 Hyperuricemia without signs of inflammatory arthritis and tophaceous disease: Secondary | ICD-10-CM

## 2014-09-22 DIAGNOSIS — Z0000 Encounter for general adult medical examination without abnormal findings: Secondary | ICD-10-CM | POA: Diagnosis not present

## 2014-09-22 DIAGNOSIS — E119 Type 2 diabetes mellitus without complications: Secondary | ICD-10-CM

## 2014-09-22 LAB — LIPID PANEL
CHOLESTEROL: 145 mg/dL (ref 0–200)
HDL: 36.6 mg/dL — ABNORMAL LOW (ref >39.00)
LDL Cholesterol: 88 mg/dL (ref 0–99)
NonHDL: 108.09
Total CHOL/HDL Ratio: 4
Triglycerides: 99 mg/dL (ref 0.0–149.0)
VLDL: 19.8 mg/dL (ref 0.0–40.0)

## 2014-09-22 LAB — CBC WITH DIFFERENTIAL/PLATELET
BASOS ABS: 0.1 10*3/uL (ref 0.0–0.1)
Basophils Relative: 0.8 % (ref 0.0–3.0)
EOS ABS: 0.3 10*3/uL (ref 0.0–0.7)
Eosinophils Relative: 4.1 % (ref 0.0–5.0)
HEMATOCRIT: 45 % (ref 39.0–52.0)
HEMOGLOBIN: 15.2 g/dL (ref 13.0–17.0)
LYMPHS PCT: 32.9 % (ref 12.0–46.0)
Lymphs Abs: 2.4 10*3/uL (ref 0.7–4.0)
MCHC: 33.8 g/dL (ref 30.0–36.0)
MCV: 94.5 fl (ref 78.0–100.0)
Monocytes Absolute: 0.5 10*3/uL (ref 0.1–1.0)
Monocytes Relative: 6.5 % (ref 3.0–12.0)
Neutro Abs: 4.1 10*3/uL (ref 1.4–7.7)
Neutrophils Relative %: 55.7 % (ref 43.0–77.0)
PLATELETS: 279 10*3/uL (ref 150.0–400.0)
RBC: 4.77 Mil/uL (ref 4.22–5.81)
RDW: 13.5 % (ref 11.5–15.5)
WBC: 7.4 10*3/uL (ref 4.0–10.5)

## 2014-09-22 LAB — COMPREHENSIVE METABOLIC PANEL
ALBUMIN: 4 g/dL (ref 3.5–5.2)
ALK PHOS: 68 U/L (ref 39–117)
ALT: 47 U/L (ref 0–53)
AST: 32 U/L (ref 0–37)
BUN: 18 mg/dL (ref 6–23)
CO2: 29 mEq/L (ref 19–32)
CREATININE: 0.9 mg/dL (ref 0.40–1.50)
Calcium: 9.4 mg/dL (ref 8.4–10.5)
Chloride: 103 mEq/L (ref 96–112)
GFR: 94.75 mL/min (ref >60.00)
Glucose, Bld: 166 mg/dL — ABNORMAL HIGH (ref 70–99)
Potassium: 4.2 mEq/L (ref 3.5–5.1)
SODIUM: 141 meq/L (ref 135–145)
TOTAL PROTEIN: 7.4 g/dL (ref 6.0–8.3)
Total Bilirubin: 0.8 mg/dL (ref 0.2–1.2)

## 2014-09-22 LAB — MICROALBUMIN / CREATININE URINE RATIO
CREATININE, U: 92.8 mg/dL
MICROALB/CREAT RATIO: 0.8 mg/g (ref 0.0–30.0)
Microalb, Ur: 0.7 mg/dL (ref 0.0–1.9)

## 2014-09-22 LAB — PSA: PSA: 0.63 ng/mL (ref 0.10–4.00)

## 2014-09-22 LAB — FOLATE: Folate: >24.8 ng/mL (ref >5.9)

## 2014-09-22 LAB — URIC ACID: Uric Acid, Serum: 9 mg/dL — ABNORMAL HIGH (ref 4.0–7.8)

## 2014-09-22 LAB — HEMOGLOBIN A1C: HEMOGLOBIN A1C: 7.7 % — AB (ref 4.6–6.5)

## 2014-09-22 LAB — TSH: TSH: 1.01 u[IU]/mL (ref 0.35–4.50)

## 2014-09-22 LAB — VITAMIN B12: Vitamin B-12: 430 pg/mL (ref 211–911)

## 2014-09-23 LAB — HIV ANTIBODY (ROUTINE TESTING W REFLEX): HIV: NONREACTIVE

## 2014-09-24 ENCOUNTER — Encounter: Payer: Self-pay | Admitting: Internal Medicine

## 2014-09-26 ENCOUNTER — Telehealth: Payer: Self-pay | Admitting: Internal Medicine

## 2014-09-26 ENCOUNTER — Other Ambulatory Visit: Payer: Self-pay | Admitting: Internal Medicine

## 2014-09-26 MED ORDER — FEBUXOSTAT 40 MG PO TABS: 40.0000 mg | ORAL_TABLET | Freq: Every day | ORAL | Status: DC

## 2014-09-26 MED ORDER — METFORMIN HCL 1000 MG PO TABS: 1000.0000 mg | ORAL_TABLET | Freq: Two times a day (BID) | ORAL | Status: DC

## 2014-09-26 MED ORDER — METFORMIN HCL 1000 MG PO TABS: 1000.0000 mg | ORAL_TABLET | Freq: Two times a day (BID) | ORAL | Status: AC

## 2014-09-26 NOTE — Telephone Encounter (Signed)
Stop allopurinol, start uloric 40 mg 1 po qd , rx sent

## 2014-09-26 NOTE — Telephone Encounter (Signed)
Pt would like to have the rx go to a different pharmacy then on file. He [facility] [address].

## 2014-09-26 NOTE — Telephone Encounter (Signed)
[facility] [place] [facility] metformin Rx. [facility] to [facility].  Pt now able to view results and instructions via mychart. States that he would like to proceed with new Rx of Uloric per PCP recommendation.  Please advise dose / directions and I can send Rx?

## 2014-09-26 NOTE — Telephone Encounter (Signed)
Caller name: Delwin Raczkowski  Relationship to patient: Self  Can be reached: [phone]  Pharmacy:  Reason for call: pt says that he left a message on mychart for Dr. Rock Nephew needs a refill on his metfromin Rx and a new Rx for Allopurinol. Pt also says that he didn't get his results via mychart for his labs. Pt request to speak with you directly.

## 2014-09-26 NOTE — Telephone Encounter (Signed)
Notified pt of PA process and we will let him know once it has been approved. He voices understanding.

## 2014-09-26 NOTE — Telephone Encounter (Signed)
PA for uloric initiated. Awaiting determination. JG//CMA

## 2014-09-29 NOTE — Telephone Encounter (Signed)
PA approved effective from 09/26/2014 through 02/03/2038

## 2014-11-25 ENCOUNTER — Encounter: Payer: Self-pay | Admitting: Emergency Medicine

## 2014-11-25 ENCOUNTER — Emergency Department: Payer: BLUE CROSS/BLUE SHIELD

## 2014-11-25 ENCOUNTER — Emergency Department
Admission: EM | Admit: 2014-11-25 | Discharge: 2014-11-25 | Disposition: A | Discharge status: Home / Self Care | Payer: BLUE CROSS/BLUE SHIELD | Attending: Emergency Medicine | Admitting: Emergency Medicine

## 2014-11-25 DIAGNOSIS — R109 Unspecified abdominal pain: Secondary | ICD-10-CM | POA: Diagnosis present

## 2014-11-25 DIAGNOSIS — Z79899 Other long term (current) drug therapy: Secondary | ICD-10-CM | POA: Insufficient documentation

## 2014-11-25 DIAGNOSIS — E119 Type 2 diabetes mellitus without complications: Secondary | ICD-10-CM | POA: Insufficient documentation

## 2014-11-25 DIAGNOSIS — E669 Obesity, unspecified: Secondary | ICD-10-CM | POA: Insufficient documentation

## 2014-11-25 DIAGNOSIS — Z7982 Long term (current) use of aspirin: Secondary | ICD-10-CM | POA: Insufficient documentation

## 2014-11-25 DIAGNOSIS — N200 Calculus of kidney: Secondary | ICD-10-CM | POA: Insufficient documentation

## 2014-11-25 LAB — COMPREHENSIVE METABOLIC PANEL
ALT: 31 U/L (ref 17–63)
AST: 32 U/L (ref 15–41)
Albumin: 3.7 g/dL (ref 3.5–5.0)
Alkaline Phosphatase: 62 U/L (ref 38–126)
Anion gap: 7 (ref 5–15)
BILIRUBIN TOTAL: 1.6 mg/dL — AB (ref 0.3–1.2)
BUN: 25 mg/dL — AB (ref 6–20)
CALCIUM: 8.5 mg/dL — AB (ref 8.9–10.3)
CHLORIDE: 101 mmol/L (ref 101–111)
CO2: 28 mmol/L (ref 22–32)
CREATININE: 1.35 mg/dL — AB (ref 0.61–1.24)
GFR, EST NON AFRICAN AMERICAN: 60 mL/min — AB (ref >60)
GLUCOSE: 191 mg/dL — AB (ref 65–99)
Potassium: 4.5 mmol/L (ref 3.5–5.1)
Sodium: 136 mmol/L (ref 135–145)
Total Protein: 7.1 g/dL (ref 6.5–8.1)

## 2014-11-25 LAB — URINALYSIS COMPLETE WITH MICROSCOPIC (ARMC ONLY)
BILIRUBIN URINE: NEGATIVE
Bacteria, UA: NONE SEEN
GLUCOSE, UA: NEGATIVE mg/dL
Leukocytes, UA: NEGATIVE
Nitrite: NEGATIVE
PH: 6 (ref 5.0–8.0)
Protein, ur: NEGATIVE mg/dL
SPECIFIC GRAVITY, URINE: 1.016 (ref 1.005–1.030)

## 2014-11-25 LAB — CBC
HCT: 43.2 % (ref 40.0–52.0)
Hemoglobin: 14.7 g/dL (ref 13.0–18.0)
MCH: 31.6 pg (ref 26.0–34.0)
MCHC: 34 g/dL (ref 32.0–36.0)
MCV: 92.7 fL (ref 80.0–100.0)
PLATELETS: 234 10*3/uL (ref 150–440)
RBC: 4.66 MIL/uL (ref 4.40–5.90)
RDW: 13.7 % (ref 11.5–14.5)
WBC: 14.8 10*3/uL — ABNORMAL HIGH (ref 3.8–10.6)

## 2014-11-25 MED ORDER — SODIUM CHLORIDE 0.9 % IV BOLUS (SEPSIS)
1000.0000 mL | Freq: Once | INTRAVENOUS | Status: AC
  Administered 2014-11-25: 1000 mL via INTRAVENOUS

## 2014-11-25 MED ORDER — ETODOLAC 500 MG PO TABS: 500.0000 mg | ORAL_TABLET | Freq: Two times a day (BID) | ORAL | Status: DC | PRN

## 2014-11-25 MED ORDER — ONDANSETRON 4 MG PO TBDP: 4.0000 mg | ORAL_TABLET | Freq: Four times a day (QID) | ORAL | Status: DC | PRN

## 2014-11-25 MED ORDER — TAMSULOSIN HCL 0.4 MG PO CAPS: 0.4000 mg | ORAL_CAPSULE | Freq: Every day | ORAL | Status: DC

## 2014-11-25 MED ORDER — HYDROCODONE-ACETAMINOPHEN 5-325 MG PO TABS: 1.0000 | ORAL_TABLET | Freq: Four times a day (QID) | ORAL | Status: DC | PRN

## 2014-11-25 MED ORDER — ONDANSETRON HCL 4 MG/2ML IJ SOLN
4.0000 mg | Freq: Once | INTRAMUSCULAR | Status: AC
  Administered 2014-11-25: 4 mg via INTRAVENOUS
  Filled 2014-11-25: qty 2

## 2014-11-25 MED ORDER — KETOROLAC TROMETHAMINE 30 MG/ML IJ SOLN
30.0000 mg | Freq: Once | INTRAMUSCULAR | Status: AC
  Administered 2014-11-25: 30 mg via INTRAVENOUS
  Filled 2014-11-25: qty 1

## 2014-11-25 NOTE — ED Notes (Signed)
Pt with right side flank pain stated yesteday, hx of kidney stones. No blood in urine and able to pass urine freely.

## 2014-11-25 NOTE — ED Provider Notes (Signed)
[facility] Emergency Department Provider Note REMINDER - THIS NOTE IS NOT A FINAL MEDICAL RECORD UNTIL IT IS SIGNED. UNTIL THEN, THE CONTENT BELOW MAY REFLECT INFORMATION FROM A DOCUMENTATION TEMPLATE, NOT THE ACTUAL PATIENT VISIT. ____________________________________________  Time seen: Approximately 1:20 PM  I have reviewed the triage vital signs and the nursing notes.   HISTORY  Chief Complaint Flank Pain    HPI Nathaniel Luna is a 49 y.o. male reports a previous history of kidney stones and diabetes. Patient reports yesterday woke up with severe right flank pain that feels like similar kidney stones for which she was told he has "6" in the past. He's never had a previous surgeries in detail the past all of his previous stones. He reports yesterday is quite nauseated and vomited a couple of times due to pain. He has not vomited today but also reports he does not want to eat.  He denies pain in the right lower quadrant, no pain in the testes or groin. Never any previous abdominal surgeries. Of note the patient does live in [place] where he has a urologist whom he follows with.  Sharp severe pain in the right flank.  Past Medical History  Diagnosis Date  . Asthma     ? of asthma   . Headache(784.0)     intense on-off, ibuprofen helps   . Seasonal allergies   . Hearing loss aprox. 2007    low tone decreased , R side, w/u neg per ENT  . Diabetes mellitus   . Urolithiasis     while in [place] 09-2013    Patient Active Problem List   Diagnosis Date Noted  . Urolithiasis -- was rx allopurinol 09/16/2014  . Elevated uric acid in blood 09/16/2014  . Advice or immunization for travel 05/21/2011  . General medical examination 12/25/2010  . Diabetes mellitus [facility])     Past Surgical History  Procedure Laterality Date  . Knee surgery  93    carthilage  repair post injury , R  . Elbow surgery Left 2012    cubital tunnel syndrome   . Wisdom tooth  extraction      Current Outpatient Rx  Name  Route  Sig  Dispense  Refill  . aspirin 325 MG tablet   Oral   Take 325 mg by mouth daily.           Marland Kitchen etodolac (LODINE) 500 MG tablet   Oral   Take 1 tablet (500 mg total) by mouth 2 (two) times daily as needed.   10 tablet   0   . febuxostat (ULORIC) 40 MG tablet   Oral   Take 1 tablet (40 mg total) by mouth daily.   90 tablet   0   . glucosamine-chondroitin 500-400 MG tablet   Oral   Take 1 tablet by mouth 2 (two) times daily.           Marland Kitchen HYDROcodone-acetaminophen (NORCO/VICODIN) 5-325 MG tablet   Oral   Take 1 tablet by mouth every 6 (six) hours as needed for moderate pain.   15 tablet   0   . metFORMIN (GLUCOPHAGE) 1000 MG tablet   Oral   Take 1 tablet (1,000 mg total) by mouth 2 (two) times daily with a meal.   180 tablet   1   . Multiple Vitamin (MULTIVITAMIN) tablet   Oral   Take 1 tablet by mouth daily.           Marland Kitchen  niacin 500 MG tablet   Oral   Take 500 mg by mouth daily with breakfast.           . ondansetron (ZOFRAN ODT) 4 MG disintegrating tablet   Oral   Take 1 tablet (4 mg total) by mouth every 6 (six) hours as needed for nausea or vomiting.   20 tablet   0   . tamsulosin (FLOMAX) 0.4 MG CAPS capsule   Oral   Take 1 capsule (0.4 mg total) by mouth daily.   7 capsule   0     Allergies Codeine  Family History  Problem Relation Age of Onset  . Rheum arthritis Father   . Breast cancer Mother   . Prostate cancer Father 87  . Stroke Mother   . AAA (abdominal aortic aneurysm) Father     AAA father dx age 74 aproxand GF  . Colon cancer Neg Hx   . Colon polyps Father     dx in his 53s  . Coronary artery disease Neg Hx   . Diabetes      GF?  Marland Kitchen Dementia Mother   . AAA (abdominal aortic aneurysm) Sister     Social History Social History  Substance Use Topics  . Smoking status: Never Smoker   . Smokeless tobacco: Never Used  . Alcohol Use: Yes     Comment: socially     Review  of Systems Constitutional: No fever/chills Eyes: No visual changes. ENT: No sore throat. Cardiovascular: Denies chest pain. Respiratory: Denies shortness of breath. Gastrointestinal:  No diarrhea.  No constipation. Genitourinary: Negative for dysuria. Urine has been dark, possibly blood in it. Musculoskeletal: Negative for back pain. Skin: Negative for rash. Neurological: Negative for headaches, focal weakness or numbness.  10-point ROS otherwise negative.  ____________________________________________   PHYSICAL EXAM:  VITAL SIGNS: ED Triage Vitals  Enc Vitals Group     BP 11/25/14 1110 134/60 mmHg     Pulse Rate 11/25/14 1109 69     Resp 11/25/14 1109 20     Temp 11/25/14 1109 98.3 F (36.8 C)     Temp src --      SpO2 11/25/14 1109 97 %     Weight 11/25/14 1109 320 lb (145.151 kg)     Height 11/25/14 1109 6' (1.829 m)     Head Cir --      Peak Flow --      Pain Score 11/25/14 1109 8     Pain Loc --      Pain Edu? --      Excl. in GC? --    Constitutional: Alert and oriented. Well appearing and in moderate painful distress holding his right flank unable to position himself comfortably in the bed. Patient is markedly obese. Eyes: Conjunctivae are normal. PERRL. EOMI. Head: Atraumatic. Nose: No congestion/rhinnorhea. Mouth/Throat: Mucous membranes are moist.  Oropharynx non-erythematous. Neck: No stridor.   Cardiovascular: Normal rate, regular rhythm. Grossly normal heart sounds.  Good peripheral circulation. Respiratory: Normal respiratory effort.  No retractions. Lungs CTAB. Gastrointestinal: Soft and nontender. No distention. No abdominal bruits. Moderate right-sided CVA tenderness. Musculoskeletal: No lower extremity tenderness nor edema.  No joint effusions. Neurologic:  Normal speech and language. No gross focal neurologic deficits are appreciated. No gait instability. Skin:  Skin is warm, dry and intact. No rash noted. Psychiatric: Mood and affect are normal.  Speech and behavior are normal.  ____________________________________________   LABS (all labs ordered are listed, but only abnormal results are displayed)  Labs Reviewed  URINALYSIS COMPLETEWITH MICROSCOPIC ([facility] ONLY) - Abnormal; Notable for the following:    Color, Urine YELLOW (*)    APPearance CLEAR (*)    Ketones, ur TRACE (*)    Hgb urine dipstick 1+ (*)    Squamous Epithelial / LPF 0-5 (*)    All other components within normal limits  COMPREHENSIVE METABOLIC PANEL  CBC   ____________________________________________  EKG   ____________________________________________  RADIOLOGY  CT RENAL STONE STUDY (Final result) Result time: 11/25/14 14:06:19   Final result by Rad Results In Interface (11/25/14 14:06:19)   Narrative:   CLINICAL DATA: Right-sided flank pain for 2 days  EXAM: CT ABDOMEN AND PELVIS WITHOUT CONTRAST  TECHNIQUE: Multidetector CT imaging of the abdomen and pelvis was performed following the standard protocol without IV contrast.  COMPARISON: None.  FINDINGS: Lung bases are free of acute infiltrate or sizable effusion. The liver is fatty infiltrated. The spleen, adrenal glands, pancreas and gallbladder are within normal limits. The left kidney demonstrates a tiny 1 mm nonobstructing stone in the upper pole. The left ureter is within normal limits. On the right, the kidney shows hydronephrosis and proximal hydroureter secondary to a 9 mm stone just below the ureteral pelvic junction. Additionally a second 7 mm stone is noted in mid to lower pole of the right kidney.  The bladder is well distended. Prostatic calcifications are seen. The appendix is within normal limits. No acute bony abnormality is noted.  IMPRESSION: 9 mm proximal right ureteral stone with obstructive changes.  Bilateral nonobstructing stones as described.     ____________________________________________   PROCEDURES  Procedure(s) performed: None  Critical  Care performed: No  ____________________________________________   INITIAL IMPRESSION / ASSESSMENT AND PLAN / ED COURSE  Pertinent labs & imaging results that were available during my care of the patient were reviewed by me and considered in my medical decision making (see chart for details).  Patient presents with sudden onset right flank pain associated nausea and vomiting with possible hematuria yesterday. His clinical history and exam seem most consistent with possible recurrent kidney stone, but other etiologies on the right including appendicitis and cholecystitis would be considered. He has no evidence of peritonitis or focal abnormality to suggest acute intra-abdominal surgical process or infection, but certainly does need pain control. I will obtain CT imaging to further evaluate for the cause of his pain, we'll check labs, and obtain pain control with Toradol.  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  D/W [place]. Recommends outpatient, elective management. Pain control, floxmax, outpatient follow-up within 1 week. Patient agreeabel and return precautions advised. Friend driving.  Ongoing care and disposition assigned to Dr. Mayford KnifeWilliams who will follow-up on CBC and comp metabolic panel result. Should these not show concerning abnormality, plan is to discharge the patient home to follow up closely with urology. ____________________________________________   FINAL CLINICAL IMPRESSION(S) / ED DIAGNOSES  Final diagnoses:  Right flank discomfort  Kidney stone on right side      Sharyn CreamerMark Jackline Castilla, MD 11/25/14 1539

## 2014-11-25 NOTE — Discharge Instructions (Signed)
You have been seen in the Emergency Department (ED) today for pain that we believe based on your workup, is caused by kidney stones.  As we have discussed, please drink plenty of fluids.  Please make a follow up appointment with the physician(s) listed elsewhere in this documentation.  You may take pain medication as needed but ONLY as prescribed.    Please see your doctor as soon as possible as stones may take 1-3 weeks to pass and you may require additional care or medications.  Do not drink alcohol, drive or participate in any other potentially dangerous activities while taking opiate pain medication as it may make you sleepy. Do not take this medication with any other sedating medications, either prescription or over-the-counter. If you were prescribed Percocet or Vicodin, do not take these with acetaminophen (Tylenol) as it is already contained within these medications.   This medication is an opiate (or narcotic) pain medication and can be habit forming.  Use it as little as possible to achieve adequate pain control.  Do not use or use it with extreme caution if you have a history of opiate abuse or dependence.  If you are on a pain contract with your primary care doctor or a pain specialist, be sure to let them know you were prescribed this medication today from the [facility] Emergency Department.  This medication is intended for your use only - do not give any to anyone else and keep it in a secure place where nobody else, especially children, have access to it.  It will also cause or worsen constipation, so you may want to consider taking an over-the-counter stool softener while you are taking this medication.  Return to the Emergency Department (ED) or call your doctor if you have any worsening pain, fever, painful urination, are unable to urinate, or develop other symptoms that concern you.   Kidney Stones Kidney stones (urolithiasis) are deposits that form inside your kidneys. The  intense pain is caused by the stone moving through the urinary tract. When the stone moves, the ureter goes into spasm around the stone. The stone is usually passed in the urine.  CAUSES   A disorder that makes certain neck glands produce too much parathyroid hormone (primary hyperparathyroidism).  A buildup of uric acid crystals, similar to gout in your joints.  Narrowing (stricture) of the ureter.  A kidney obstruction present at birth (congenital obstruction).  Previous surgery on the kidney or ureters.  Numerous kidney infections. SYMPTOMS   Feeling sick to your stomach (nauseous).  Throwing up (vomiting).  Blood in the urine (hematuria).  Pain that usually spreads (radiates) to the groin.  Frequency or urgency of urination. DIAGNOSIS   Taking a history and physical exam.  Blood or urine tests.  CT scan.  Occasionally, an examination of the inside of the urinary bladder (cystoscopy) is performed. TREATMENT   Observation.  Increasing your fluid intake.  Extracorporeal shock wave lithotripsy--This is a noninvasive procedure that uses shock waves to break up kidney stones.  Surgery may be needed if you have severe pain or persistent obstruction. There are various surgical procedures. Most of the procedures are performed with the use of small instruments. Only small incisions are needed to accommodate these instruments, so recovery time is minimized. The size, location, and chemical composition are all important variables that will determine the proper choice of action for you. Talk to your health care provider to better understand your situation so that you will minimize the  risk of injury to yourself and your kidney.  HOME CARE INSTRUCTIONS   Drink enough water and fluids to keep your urine clear or pale yellow. This will help you to pass the stone or stone fragments.  Strain all urine through the provided strainer. Keep all particulate matter and stones for your  health care provider to see. The stone causing the pain may be as small as a grain of salt. It is very important to use the strainer each and every time you pass your urine. The collection of your stone will allow your health care provider to analyze it and verify that a stone has actually passed. The stone analysis will often identify what you can do to reduce the incidence of recurrences.  Only take over-the-counter or prescription medicines for pain, discomfort, or fever as directed by your health care provider.  Keep all follow-up visits as told by your health care provider. This is important.  Get follow-up X-rays if required. The absence of pain does not always mean that the stone has passed. It may have only stopped moving. If the urine remains completely obstructed, it can cause loss of kidney function or even complete destruction of the kidney. It is your responsibility to make sure X-rays and follow-ups are completed. Ultrasounds of the kidney can show blockages and the status of the kidney. Ultrasounds are not associated with any radiation and can be performed easily in a matter of minutes.  Make changes to your daily diet as told by your health care provider. You may be told to:  Limit the amount of salt that you eat.  Eat 5 or more servings of fruits and vegetables each day.  Limit the amount of meat, poultry, fish, and eggs that you eat.  Collect a 24-hour urine sample as told by your health care provider.You may need to collect another urine sample every 6-12 months. SEEK MEDICAL CARE IF:  You experience pain that is progressive and unresponsive to any pain medicine you have been prescribed. SEEK IMMEDIATE MEDICAL CARE IF:   Pain cannot be controlled with the prescribed medicine.  You have a fever or shaking chills.  The severity or intensity of pain increases over 18 hours and is not relieved by pain medicine.  You develop a new onset of abdominal pain.  You feel faint  or pass out.  You are unable to urinate.   This information is not intended to replace advice given to you by your health care provider. Make sure you discuss any questions you have with your health care provider.   Document Released: 01/21/2005 Document Revised: 10/12/2014 Document Reviewed: 06/24/2012 Elsevier Interactive Patient Education [employer]2016 Elsevier Inc.

## 2014-11-25 NOTE — ED Provider Notes (Signed)
Labs Reviewed  URINALYSIS COMPLETEWITH MICROSCOPIC ([facility] ONLY) - Abnormal; Notable for the following:    Color, Urine YELLOW (*)    APPearance CLEAR (*)    Ketones, ur TRACE (*)    Hgb urine dipstick 1+ (*)    Squamous Epithelial / LPF 0-5 (*)    All other components within normal limits  COMPREHENSIVE METABOLIC PANEL - Abnormal; Notable for the following:    Glucose, Bld 191 (*)    BUN 25 (*)    Creatinine, Ser 1.35 (*)    Calcium 8.5 (*)    Total Bilirubin 1.6 (*)    GFR calc non Af Amer 60 (*)    All other components within normal limits  CBC - Abnormal; Notable for the following:    WBC 14.8 (*)    All other components within normal limits   Patient has been cleared for discharge, labs are grossly unremarkable. Patient did receive a liter of saline. We'll refer to palpation as previously planned by Dr. Fanny BienQuale.   Emily FilbertJonathan E Chrisie Jankovich, MD 11/25/14 [id number]

## 2014-11-28 ENCOUNTER — Telehealth: Payer: Self-pay | Admitting: Internal Medicine

## 2014-11-28 MED ORDER — ALLOPURINOL 300 MG PO TABS: 300.0000 mg | ORAL_TABLET | Freq: Every day | ORAL | Status: AC

## 2014-11-28 NOTE — Telephone Encounter (Signed)
To my knowledge, the PA was okay by his insurance, if the medications is still that expensive I don't have any other alternative except go back on allopurinol if he desires .  (Allopurinol was discontinued because it was not decreasing the uric acid level, no side effects that I know)

## 2014-11-28 NOTE — Telephone Encounter (Signed)
Spoke with [facility] in [place], informed that the cost of Uloric is the price with the approved PA. Informed I would call Pt and discuss alternatives.

## 2014-11-28 NOTE — Telephone Encounter (Signed)
[facility] [place] called to notify the pt to pick up RX for ULORIC. Pt declined meds because they were $285.90 because insurance did not cover. They are requesting different med on pts behalf.

## 2014-11-28 NOTE — Telephone Encounter (Signed)
Spoke with Pt, he agreed to go back on Allopurinol 300 mg 1 tablet by mouth daily ([facility] [place]), Uloric d/c.

## 2014-11-28 NOTE — Telephone Encounter (Signed)
Please advise 

## 2014-11-30 ENCOUNTER — Encounter: Payer: Self-pay | Admitting: Emergency Medicine

## 2014-11-30 ENCOUNTER — Emergency Department
Admission: EM | Admit: 2014-11-30 | Discharge: 2014-11-30 | Disposition: A | Discharge status: Home / Self Care | Payer: BLUE CROSS/BLUE SHIELD | Attending: Emergency Medicine | Admitting: Emergency Medicine

## 2014-11-30 ENCOUNTER — Emergency Department: Payer: BLUE CROSS/BLUE SHIELD

## 2014-11-30 DIAGNOSIS — N200 Calculus of kidney: Secondary | ICD-10-CM | POA: Insufficient documentation

## 2014-11-30 DIAGNOSIS — Z7982 Long term (current) use of aspirin: Secondary | ICD-10-CM | POA: Diagnosis not present

## 2014-11-30 DIAGNOSIS — Z79899 Other long term (current) drug therapy: Secondary | ICD-10-CM | POA: Insufficient documentation

## 2014-11-30 DIAGNOSIS — R109 Unspecified abdominal pain: Secondary | ICD-10-CM | POA: Diagnosis present

## 2014-11-30 DIAGNOSIS — E119 Type 2 diabetes mellitus without complications: Secondary | ICD-10-CM | POA: Diagnosis not present

## 2014-11-30 DIAGNOSIS — N1330 Unspecified hydronephrosis: Secondary | ICD-10-CM | POA: Diagnosis not present

## 2014-11-30 LAB — URINALYSIS COMPLETE WITH MICROSCOPIC (ARMC ONLY)
Bacteria, UA: NONE SEEN
Glucose, UA: NEGATIVE mg/dL
HGB URINE DIPSTICK: NEGATIVE
KETONES UR: NEGATIVE mg/dL
LEUKOCYTES UA: NEGATIVE
Nitrite: NEGATIVE
PH: 5 (ref 5.0–8.0)
PROTEIN: NEGATIVE mg/dL
Specific Gravity, Urine: 1.026 (ref 1.005–1.030)

## 2014-11-30 NOTE — ED Notes (Signed)
Pt given urine cup at this time. Ambulatory to bathroom at this time with no concerns.

## 2014-11-30 NOTE — ED Provider Notes (Signed)
[facility] Emergency Department Provider Note  ____________________________________________  Time seen: Approximately 3:15 PM  I have reviewed the triage vital signs and the nursing notes.   HISTORY  Chief Complaint Flank Pain    HPI Nathaniel Luna is a 49 y.o. male with a history of morbid obesity and recurrent kidney stones and who was seen here 4 days ago and diagnosed with a 7-9 mm obstructive stone on the right side.  Urology was consult the during that visit and the decision was made for conservative treatment with Flomax and pain medication and outpatient follow-up.  The patient states that his pain has persisted and though it is no worse it is also no better.  He states he has not yet been able to follow up with urology and the earliest he can get in to see them this next week.  He is unsure if he is supposed to be feeling better at this time, although he did acknowledge that during a kidney stone in the past it took approximately 3 weeks for him to pass it.  He denies fever/chills, chest pain, shortness of breath, dysuria.  He continues to be able to urinate well.  He describes the pain as mild to severe at different times, present in his right flank, sharp and stabbing, and ketorolac makes it significantly better and movement makes it worse.  Vicodin also helps with severe pain.   Past Medical History  Diagnosis Date  . Asthma     ? of asthma   . Headache(784.0)     intense on-off, ibuprofen helps   . Seasonal allergies   . Hearing loss aprox. 2007    low tone decreased , R side, w/u neg per ENT  . Diabetes mellitus   . Urolithiasis     while in [place] 09-2013    Patient Active Problem List   Diagnosis Date Noted  . Urolithiasis -- was rx allopurinol 09/16/2014  . Elevated uric acid in blood 09/16/2014  . Advice or immunization for travel 05/21/2011  . General medical examination 12/25/2010  . Diabetes mellitus [facility](HCC)     Past Surgical  History  Procedure Laterality Date  . Knee surgery  93    carthilage  repair post injury , R  . Elbow surgery Left 2012    cubital tunnel syndrome   . Wisdom tooth extraction      Current Outpatient Rx  Name  Route  Sig  Dispense  Refill  . allopurinol (ZYLOPRIM) 300 MG tablet   Oral   Take 1 tablet (300 mg total) by mouth daily.   30 tablet   6     Replacing Uloric   . aspirin EC 325 MG tablet   Oral   Take 325 mg by mouth daily.         Marland Kitchen. etodolac (LODINE) 500 MG tablet   Oral   Take 1 tablet (500 mg total) by mouth 2 (two) times daily as needed.   10 tablet   0   . glucosamine-chondroitin 500-400 MG tablet   Oral   Take 1 tablet by mouth 2 (two) times daily.           Marland Kitchen. HYDROcodone-acetaminophen (NORCO/VICODIN) 5-325 MG tablet   Oral   Take 1 tablet by mouth every 6 (six) hours as needed for moderate pain.   15 tablet   0   . metFORMIN (GLUCOPHAGE) 1000 MG tablet   Oral   Take 1 tablet (1,000 mg total)  by mouth 2 (two) times daily with a meal.   180 tablet   1   . Multiple Vitamin (MULTIVITAMIN WITH MINERALS) TABS tablet   Oral   Take 1 tablet by mouth daily.         . ondansetron (ZOFRAN ODT) 4 MG disintegrating tablet   Oral   Take 1 tablet (4 mg total) by mouth every 6 (six) hours as needed for nausea or vomiting.   20 tablet   0   . tamsulosin (FLOMAX) 0.4 MG CAPS capsule   Oral   Take 1 capsule (0.4 mg total) by mouth daily.   7 capsule   0     Allergies Codeine  Family History  Problem Relation Age of Onset  . Rheum arthritis Father   . Breast cancer Mother   . Prostate cancer Father 71  . Stroke Mother   . AAA (abdominal aortic aneurysm) Father     AAA father dx age 12 aproxand GF  . Colon cancer Neg Hx   . Colon polyps Father     dx in his 39s  . Coronary artery disease Neg Hx   . Diabetes      GF?  Marland Kitchen Dementia Mother   . AAA (abdominal aortic aneurysm) Sister     Social History Social History  Substance Use Topics   . Smoking status: Never Smoker   . Smokeless tobacco: Never Used  . Alcohol Use: Yes     Comment: socially     Review of Systems Constitutional: No fever/chills Eyes: No visual changes. ENT: No sore throat. Cardiovascular: Denies chest pain. Respiratory: Denies shortness of breath. Gastrointestinal: No abdominal pain.  No nausea, no vomiting.  No diarrhea.  No constipation. Genitourinary: Negative for dysuria. Musculoskeletal: Right flank pain similar to prior Skin: Negative for rash. Neurological: Negative for headaches, focal weakness or numbness.  10-point ROS otherwise negative.  ____________________________________________   PHYSICAL EXAM:  VITAL SIGNS: ED Triage Vitals  Enc Vitals Group     BP 11/30/14 1411 138/74 mmHg     Pulse Rate 11/30/14 1411 87     Resp 11/30/14 1411 16     Temp 11/30/14 1411 98.5 F (36.9 C)     Temp Source 11/30/14 1411 Oral     SpO2 11/30/14 1411 96 %     Weight 11/30/14 1411 320 lb (145.151 kg)     Height 11/30/14 1411 6' (1.829 m)     Head Cir --      Peak Flow --      Pain Score 11/30/14 1412 1     Pain Loc --      Pain Edu? --      Excl. in GC? --     Constitutional: Alert and oriented. Well appearing and in no acute distress. Eyes: Conjunctivae are normal. PERRL. EOMI. Head: Atraumatic. Nose: No congestion/rhinnorhea. Mouth/Throat: Mucous membranes are moist.  Oropharynx non-erythematous. Neck: No stridor.   Cardiovascular: Normal rate, regular rhythm. Grossly normal heart sounds.  Good peripheral circulation. Respiratory: Normal respiratory effort.  No retractions. Lungs CTAB. Gastrointestinal: Morbid obesity.  Soft and nontender. No distention. No abdominal bruits. No CVA tenderness. Musculoskeletal: No lower extremity tenderness nor edema.  No joint effusions. Neurologic:  Normal speech and language. No gross focal neurologic deficits are appreciated.  Skin:  Skin is warm, dry and intact. No rash noted. Psychiatric:  Mood and affect are normal. Speech and behavior are normal.  ____________________________________________   LABS (all labs ordered are listed,  but only abnormal results are displayed)  Labs Reviewed  URINALYSIS COMPLETEWITH MICROSCOPIC ([facility] ONLY) - Abnormal; Notable for the following:    Color, Urine YELLOW (*)    APPearance CLEAR (*)    Bilirubin Urine 2+ (*)    Squamous Epithelial / LPF 0-5 (*)    All other components within normal limits   ____________________________________________  EKG  Not indicated ____________________________________________  RADIOLOGY   US Renal  11/30/2014  CLINICAL DATA:  Right flank pain for 8 days EXAM: RENAL / URINARY TRACT ULTRASOUND COMPLETE COMPARISON:  November 25, 2014 CT abdomen and pelvis FINDINGS: Right Kidney: Length: 13.8 cm. Echogenicity and renal cortical thickness are within normal limits. No mass or perinephric fluid visualized. There is moderate hydronephrosis on the right. There is and echogenic focus in the mid right kidney measuring 9 mm, likely an intrarenal calculus. No ureterectasis is noted on this study. Left Kidney: Length: 12.8 cm. Echogenicity and renal cortical thickness are within normal limits. No mass, perinephric fluid, or hydronephrosis visualized. A 9 mm echogenic focus in the lower pole the left kidney is noted which shows minimal shadowing. This finding was not present on recent CT and may represent a localized arcuate artery. No ureterectasis. Bladder: Appears normal for degree of bladder distention. IMPRESSION: Moderate right-sided hydronephrosis. No ureteral dilatation is appreciable. There is a questionable calculus in the mid right kidney region, also seen on recent CT. Study otherwise unremarkable. Electronically Signed   By: Bretta Bang III M.D.   On: 11/30/2014 16:55    ____________________________________________   PROCEDURES  Procedure(s) performed: None  Critical Care performed:  No ____________________________________________   INITIAL IMPRESSION / ASSESSMENT AND PLAN / ED COURSE  Pertinent labs & imaging results that were available during my care of the patient were reviewed by me and considered in my medical decision making (see chart for details).  The patient looks very well, ambulatory without difficulty, has normal vital signs, and does not even have any CVA tenderness.  I will evaluate him with an ultrasound to make sure he does not have severe hydronephrosis and I will check another urinalysis to make sure there is no sign of infection.  However, if his ultrasound is reassuring it will be appropriate for him to follow up as an outpatient in the urology clinic.  ----------------------------------------- 5:44 PM on 11/30/2014 -----------------------------------------  U/S reassuring with no hydroureter and moderate hydronephrosis.  No evidence of infection on today's urinalysis.  I spoke by phone with Dr. Berneice Heinrich the urologist who recommended the patient be worked into clinic tomorrow.  However, in the meantime, the patient called the clinic and schedule an appointment for follow-up at 11 AM.  The patient has medication at home and will follow-up tomorrow as planned.  ____________________________________________  FINAL CLINICAL IMPRESSION(S) / ED DIAGNOSES  Final diagnoses:  Kidney stones  Hydronephrosis of right kidney      NEW MEDICATIONS STARTED DURING THIS VISIT:  Discharge Medication List as of 11/30/2014  5:40 PM       Loleta Rose, MD 11/30/14 2053

## 2014-11-30 NOTE — ED Notes (Signed)
MD Forbach at bedside. 

## 2014-11-30 NOTE — ED Notes (Addendum)
Pt reports seen here on Friday and dx with 7-238mm kidney stone, reports continued pain to right lower back. Pt reports he's almost out of pain medication and flomax.

## 2014-11-30 NOTE — Discharge Instructions (Signed)
Please follow up tomorrow as planned with the urology clinic.  Fortunately your urinalysis still shows no sign of infection and your hydronephrosis does not seem to be getting worse.  They can help you with additional management options in the morning.  Return to the emergency department if you develop new or worsening symptoms that concern you.   Hydronephrosis Hydronephrosis is the enlargement of a kidney due to a blockage that stops urine from flowing out of the body. CAUSES Common causes of this condition include:  A birth (congenital) defect of the kidney.  A congenital defect of the tube through which urine travels (ureter).  Kidney stones.  An enlarged prostate gland.  A tumor.  Cancer of the prostate, bladder, uterus, ovary, or colon.  A blood clot. SYMPTOMS Symptoms of this condition include:  Pain or discomfort in your side (flank).  Swelling of the abdomen.  Pain in the abdomen.  Nausea and vomiting.  Fever.  Pain while passing urine.  Feeling of urgency to urinate.  Frequent urination.  Infection of the urinary tract. In some cases, there are no symptoms. DIAGNOSIS This condition may be diagnosed with:  A medical history.  A physical exam.  Blood and urine tests to check kidney function.  Imaging tests, such as an X-ray, ultrasound, CT scan, or MRI.  A test in which a rigid or flexible telescope (cystoscope) is used to view the site of the blockage. TREATMENT Treatment for this condition depends on where the blockage is located, how long it has been there, and what caused it. The goal of treatment is to remove the blockage. Treatment options include:  A procedure to put in a soft tube to help drain urine.  Antibiotic medicines to treat or prevent infection.  Shock-wave therapy (lithotripsy) to help eliminate kidney stones. HOME CARE INSTRUCTIONS  Get lots of rest.  Drink enough fluid to keep your urine clear or pale yellow.  If you have  a drain in, follow your health care provider's instructions about how to care for it.  Take medicines only as directed by your health care provider.  If you were prescribed an antibiotic medicine, finish all of it even if you start to feel better.  Keep all follow-up visits as directed by your health care provider. This is important. SEEK MEDICAL CARE IF:  You continue to have symptoms after treatment.  You develop new symptoms.  You have a problem with a drainage device.  Your urine becomes cloudy or bloody.  You have a fever. SEEK IMMEDIATE MEDICAL CARE IF:  You have severe flank or abdominal pain.  You develop vomiting and are unable to keep fluids down.   This information is not intended to replace advice given to you by your health care provider. Make sure you discuss any questions you have with your health care provider.   Document Released: 11/18/2006 Document Revised: 06/07/2014 Document Reviewed: 01/17/2014 Elsevier Interactive Patient Education 2016 [employer]  Kidney Stones Kidney stones (urolithiasis) are deposits that form inside your kidneys. The intense pain is caused by the stone moving through the urinary tract. When the stone moves, the ureter goes into spasm around the stone. The stone is usually passed in the urine.  CAUSES   A disorder that makes certain neck glands produce too much parathyroid hormone (primary hyperparathyroidism).  A buildup of uric acid crystals, similar to gout in your joints.  Narrowing (stricture) of the ureter.  A kidney obstruction present at birth (congenital obstruction).  Previous  surgery on the kidney or ureters.  Numerous kidney infections. SYMPTOMS   Feeling sick to your stomach (nauseous).  Throwing up (vomiting).  Blood in the urine (hematuria).  Pain that usually spreads (radiates) to the groin.  Frequency or urgency of urination. DIAGNOSIS   Taking a history and physical exam.  Blood or urine  tests.  CT scan.  Occasionally, an examination of the inside of the urinary bladder (cystoscopy) is performed. TREATMENT   Observation.  Increasing your fluid intake.  Extracorporeal shock wave lithotripsy--This is a noninvasive procedure that uses shock waves to break up kidney stones.  Surgery may be needed if you have severe pain or persistent obstruction. There are various surgical procedures. Most of the procedures are performed with the use of small instruments. Only small incisions are needed to accommodate these instruments, so recovery time is minimized. The size, location, and chemical composition are all important variables that will determine the proper choice of action for you. Talk to your health care provider to better understand your situation so that you will minimize the risk of injury to yourself and your kidney.  HOME CARE INSTRUCTIONS   Drink enough water and fluids to keep your urine clear or pale yellow. This will help you to pass the stone or stone fragments.  Strain all urine through the provided strainer. Keep all particulate matter and stones for your health care provider to see. The stone causing the pain may be as small as a grain of salt. It is very important to use the strainer each and every time you pass your urine. The collection of your stone will allow your health care provider to analyze it and verify that a stone has actually passed. The stone analysis will often identify what you can do to reduce the incidence of recurrences.  Only take over-the-counter or prescription medicines for pain, discomfort, or fever as directed by your health care provider.  Keep all follow-up visits as told by your health care provider. This is important.  Get follow-up X-rays if required. The absence of pain does not always mean that the stone has passed. It may have only stopped moving. If the urine remains completely obstructed, it can cause loss of kidney function or even  complete destruction of the kidney. It is your responsibility to make sure X-rays and follow-ups are completed. Ultrasounds of the kidney can show blockages and the status of the kidney. Ultrasounds are not associated with any radiation and can be performed easily in a matter of minutes.  Make changes to your daily diet as told by your health care provider. You may be told to:  Limit the amount of salt that you eat.  Eat 5 or more servings of fruits and vegetables each day.  Limit the amount of meat, poultry, fish, and eggs that you eat.  Collect a 24-hour urine sample as told by your health care provider.You may need to collect another urine sample every 6-12 months. SEEK MEDICAL CARE IF:  You experience pain that is progressive and unresponsive to any pain medicine you have been prescribed. SEEK IMMEDIATE MEDICAL CARE IF:   Pain cannot be controlled with the prescribed medicine.  You have a fever or shaking chills.  The severity or intensity of pain increases over 18 hours and is not relieved by pain medicine.  You develop a new onset of abdominal pain.  You feel faint or pass out.  You are unable to urinate.   This information is not intended  to replace advice given to you by your health care provider. Make sure you discuss any questions you have with your health care provider.   Document Released: 01/21/2005 Document Revised: 10/12/2014 Document Reviewed: 06/24/2012 Elsevier Interactive Patient Education [employer]

## 2014-12-01 ENCOUNTER — Ambulatory Visit
Admission: RE | Admit: 2014-12-01 | Discharge: 2014-12-01 | Disposition: A | Discharge status: Home / Self Care | Payer: BLUE CROSS/BLUE SHIELD | Source: Ambulatory Visit | Attending: Urology | Admitting: Urology

## 2014-12-01 ENCOUNTER — Encounter: Payer: Self-pay | Admitting: Urology

## 2014-12-01 ENCOUNTER — Ambulatory Visit (INDEPENDENT_AMBULATORY_CARE_PROVIDER_SITE_OTHER): Payer: BLUE CROSS/BLUE SHIELD | Admitting: Urology

## 2014-12-01 VITALS — BP 142/87 | HR 82 | Ht 72.0 in | Wt 325.4 lb

## 2014-12-01 DIAGNOSIS — N200 Calculus of kidney: Secondary | ICD-10-CM

## 2014-12-01 DIAGNOSIS — N201 Calculus of ureter: Secondary | ICD-10-CM

## 2014-12-01 DIAGNOSIS — E119 Type 2 diabetes mellitus without complications: Secondary | ICD-10-CM

## 2014-12-01 DIAGNOSIS — E669 Obesity, unspecified: Secondary | ICD-10-CM

## 2014-12-01 DIAGNOSIS — M109 Gout, unspecified: Secondary | ICD-10-CM

## 2014-12-01 HISTORY — DX: Obesity, unspecified: E66.9

## 2014-12-01 HISTORY — DX: Gout, unspecified: M10.9

## 2014-12-01 HISTORY — DX: Type 2 diabetes mellitus without complications: E11.9

## 2014-12-01 LAB — URINALYSIS, COMPLETE
Bilirubin, UA: POSITIVE — AB
Glucose, UA: NEGATIVE
KETONES UA: NEGATIVE
LEUKOCYTES UA: NEGATIVE
Nitrite, UA: NEGATIVE
PH UA: 5.5 (ref 5.0–7.5)
Protein, UA: NEGATIVE
SPEC GRAV UA: 1.025 (ref 1.005–1.030)
Urobilinogen, Ur: 0.2 mg/dL (ref 0.2–1.0)

## 2014-12-01 LAB — MICROSCOPIC EXAMINATION
EPITHELIAL CELLS (NON RENAL): NONE SEEN /HPF (ref 0–10)
RENAL EPITHEL UA: NONE SEEN /HPF

## 2014-12-01 MED ORDER — TAMSULOSIN HCL 0.4 MG PO CAPS: 0.4000 mg | ORAL_CAPSULE | Freq: Every day | ORAL | Status: DC

## 2014-12-01 NOTE — Progress Notes (Signed)
12/01/2014 12:40 PM   Nathaniel Luna [birth year] [id number]  Referring provider: Wanda PlumpJose E Paz, MD [address] [place], [place] [id number]  Chief Complaint  Patient presents with  . Nephrolithiasis    f/u ER , [place] out [place] state     HPI: Patient is a 49 year old white male who was seen in the emergency room on 11/25/2014 for a 9 mm proximal right ureteral stone with obstructive changes who presents today for further evaluation and management.  Patient has a history of nephrolithiasis and takes medications prescribed to him by a doctor in [place] for his stones.  I'm not familiar with these medications nor do I know his stone composition's.  He states he's never had to undergo surgery for stones and is passed them spontaneously.  He has not had a metabolic workup.  3 weeks ago patient had the sudden onset of right-sided flank pain. It became worse as the day progressed.  He then sought treatment in the emergency room. He denied any gross hematuria associated with the stone. He does have microscopic hematuria on his urinalysis with us today.  Today, he feels he may have passed the stone because he has not had any further pain.  He has not had fevers, chills, nausea or vomiting.  His baseline voices systems are frequent urination, urgency and nocturia.  PMH: Past Medical History  Diagnosis Date  . Asthma     ? of asthma   . Headache(784.0)     intense on-off, ibuprofen helps   . Seasonal allergies   . Hearing loss aprox. 2007    low tone decreased , R side, w/u neg per ENT  . Diabetes mellitus   . Urolithiasis     while in [place] 09-2013  . Diabetes mellitus (HCC)   . Type 2 diabetes mellitus (HCC) 12/01/2014  . General medical examination 12/25/2010  . Advice or immunization for travel 05/21/2011  . Gout 12/01/2014  . Adiposity 12/01/2014    Surgical History: Past Surgical History  Procedure Laterality Date  . Knee surgery  93    carthilage   repair post injury , R  . Elbow surgery Left 2012    cubital tunnel syndrome   . Wisdom tooth extraction      Home Medications:    Medication List       This list is accurate as of: 12/01/14 11:59 PM.  Always use your most recent med list.               allopurinol 300 MG tablet  Commonly known as:  ZYLOPRIM  Take 1 tablet (300 mg total) by mouth daily.     aspirin EC 325 MG tablet  Take 325 mg by mouth daily.     etodolac 500 MG tablet  Commonly known as:  LODINE  Take 1 tablet (500 mg total) by mouth 2 (two) times daily as needed.     glucosamine-chondroitin 500-400 MG tablet  Take 1 tablet by mouth 2 (two) times daily.     HYDROcodone-acetaminophen 5-325 MG tablet  Commonly known as:  NORCO/VICODIN  Take 1 tablet by mouth every 6 (six) hours as needed for moderate pain.     metFORMIN 1000 MG tablet  Commonly known as:  GLUCOPHAGE  Take 1 tablet (1,000 mg total) by mouth 2 (two) times daily with a meal.     multivitamin with minerals Tabs tablet  Take 1 tablet by mouth daily.  ondansetron 4 MG disintegrating tablet  Commonly known as:  ZOFRAN ODT  Take 1 tablet (4 mg total) by mouth every 6 (six) hours as needed for nausea or vomiting.     tamsulosin 0.4 MG Caps capsule  Commonly known as:  FLOMAX  Take 1 capsule (0.4 mg total) by mouth daily.     UNABLE TO FIND  Med Name: Cystone / [place] TO FIND  Med Name: Renalof (Catalysis)     UNABLE TO FIND  Med Name: Rowatinex ([place])        Allergies:  Allergies  Allergen Reactions  . Codeine Nausea And Vomiting    Family History: Family History  Problem Relation Age of Onset  . Rheum arthritis Father   . Breast cancer Mother   . Prostate cancer Father 66  . Stroke Mother   . AAA (abdominal aortic aneurysm) Father     AAA father dx age 50 aproxand GF  . Colon cancer Neg Hx   . Colon polyps Father     dx in his 59s  . Coronary artery disease Neg Hx   . Diabetes      GF?  Marland Kitchen  Dementia Mother   . AAA (abdominal aortic aneurysm) Sister     Social History:  reports that he has never smoked. He has never used smokeless tobacco. He reports that he drinks alcohol. He reports that he does not use illicit drugs.  ROS: UROLOGY Frequent Urination?: Yes Hard to postpone urination?: Yes Burning/pain with urination?: No Get up at night to urinate?: Yes Leakage of urine?: No Urine stream starts and stops?: No Trouble starting stream?: No Do you have to strain to urinate?: No Blood in urine?: Yes Urinary tract infection?: No Sexually transmitted disease?: No Injury to kidneys or bladder?: No Painful intercourse?: No Weak stream?: No Erection problems?: No Penile pain?: No  Gastrointestinal Nausea?: No Vomiting?: No Indigestion/heartburn?: No Diarrhea?: No Constipation?: No  Constitutional Fever: No Night sweats?: No Weight loss?: No Fatigue?: No  Skin Skin rash/lesions?: No Itching?: No  Eyes Blurred vision?: No Double vision?: No  Ears/Nose/Throat Sore throat?: No Sinus problems?: No  Hematologic/Lymphatic Swollen glands?: No Easy bruising?: No  Cardiovascular Leg swelling?: No Chest pain?: No  Respiratory Cough?: No Shortness of breath?: No  Endocrine Excessive thirst?: No  Musculoskeletal Back pain?: No Joint pain?: Yes  Neurological Headaches?: No Dizziness?: No  Psychologic Depression?: No Anxiety?: No  Physical Exam: BP 142/87 mmHg  Pulse 82  Ht 6' (1.829 m)  Wt 325 lb 6.4 oz (147.6 kg)  BMI 44.12 kg/m2  Constitutional: Well nourished. Alert and oriented, No acute distress. HEENT: [place] AT, moist mucus membranes. Trachea midline, no masses. Cardiovascular: No clubbing, cyanosis, or edema. Respiratory: Normal respiratory effort, no increased work of breathing. GI: Abdomen is soft, non tender, non distended, no abdominal masses. Liver and spleen not palpable.  No hernias appreciated.  Stool sample for occult  testing is not indicated.   GU: No CVA tenderness.  No bladder fullness or masses.  Skin: No rashes, bruises or suspicious lesions. Lymph: No cervical or inguinal adenopathy. Neurologic: Grossly intact, no focal deficits, moving all 4 extremities. Psychiatric: Normal mood and affect.  Laboratory Data: Lab Results  Component Value Date   WBC 14.8* 11/25/2014   HGB 14.7 11/25/2014   HCT 43.2 11/25/2014   MCV 92.7 11/25/2014   PLT 234 11/25/2014    Lab Results  Component Value Date   CREATININE 1.35* 11/25/2014  Lab Results  Component Value Date   PSA 0.63 09/22/2014    Lab Results  Component Value Date   HGBA1C 7.7* 09/22/2014    Urinalysis Results for orders placed or performed in visit on 12/01/14  Microscopic Examination  Result Value Ref Range   WBC, UA 0-5 0 -  5 /hpf   RBC, UA 3-10 (A) 0 -  2 /hpf   Epithelial Cells (non renal) None seen 0 - 10 /hpf   Renal Epithel, UA None seen None seen /hpf   Bacteria, UA Few (A) None seen/Few  Urinalysis, Complete  Result Value Ref Range   Specific Gravity, UA 1.025 1.005 - 1.030   pH, UA 5.5 5.0 - 7.5   Color, UA Yellow Yellow   Appearance Ur Clear Clear   Leukocytes, UA Negative Negative   Protein, UA Negative Negative/Trace   Glucose, UA Negative Negative   Ketones, UA Negative Negative   RBC, UA 1+ (A) Negative   Bilirubin, UA Positive (A) Negative   Urobilinogen, Ur 0.2 0.2 - 1.0 mg/dL   Nitrite, UA Negative Negative   Microscopic Examination See below:     Pertinent Imaging: CLINICAL DATA: Right-sided flank pain for 2 days  EXAM: CT ABDOMEN AND PELVIS WITHOUT CONTRAST  TECHNIQUE: Multidetector CT imaging of the abdomen and pelvis was performed following the standard protocol without IV contrast.  COMPARISON: None.  FINDINGS: Lung bases are free of acute infiltrate or sizable effusion. The liver is fatty infiltrated. The spleen, adrenal glands, pancreas and gallbladder are within normal  limits. The left kidney demonstrates a tiny 1 mm nonobstructing stone in the upper pole. The left ureter is within normal limits. On the right, the kidney shows hydronephrosis and proximal hydroureter secondary to a 9 mm stone just below the ureteral pelvic junction. Additionally a second 7 mm stone is noted in mid to lower pole of the right kidney.  The bladder is well distended. Prostatic calcifications are seen. The appendix is within normal limits. No acute bony abnormality is noted.  IMPRESSION: 9 mm proximal right ureteral stone with obstructive changes.  Bilateral nonobstructing stones as described.   Electronically Signed  By: Alcide Clever M.D.  On: 11/25/2014 14:06   CLINICAL DATA: Right flank pain for 8 days  EXAM: RENAL / URINARY TRACT ULTRASOUND COMPLETE  COMPARISON: November 25, 2014 CT abdomen and pelvis  FINDINGS: Right Kidney:  Length: 13.8 cm. Echogenicity and renal cortical thickness are within normal limits. No mass or perinephric fluid visualized. There is moderate hydronephrosis on the right. There is and echogenic focus in the mid right kidney measuring 9 mm, likely an intrarenal calculus. No ureterectasis is noted on this study.  Left Kidney:  Length: 12.8 cm. Echogenicity and renal cortical thickness are within normal limits. No mass, perinephric fluid, or hydronephrosis visualized. A 9 mm echogenic focus in the lower pole the left kidney is noted which shows minimal shadowing. This finding was not present on recent CT and may represent a localized arcuate artery. No ureterectasis.  Bladder:  Appears normal for degree of bladder distention.  IMPRESSION: Moderate right-sided hydronephrosis. No ureteral dilatation is appreciable. There is a questionable calculus in the mid right kidney region, also seen on recent CT. Study otherwise unremarkable.   Electronically Signed  By: Bretta Bang III M.D.  On:  11/30/2014 16:55      Assessment & Plan:    1. Right proximal ureteral stone:   I discussed with the patient URS/LL/stent placement versus ESWL.  The patient is convinced he has passed the stone since he has not had any further pain.   He does not recall passing a specific fragment.   He does not want to undergo a URS, but he would like to see if he is a candidate for ESWL.   I explained to him that the stone would need to be readily visible on a plain film and he will obtain one today.   If the stone is not visible, we will repeat the renal ultrasound.  If the renal ultrasound is positive for hydronephrosis, he is agreeable to proceed with URS/LL/stent placement.   I explained to the patient how the procedure is performed and the risks involved.    I informed patient that he will have a stent placed during the procedure and will remain in place after the procedure for a short time.  It will be removed in the office with a cystoscope, unless a string in left in place.  I informed that patient that about 50% of patients who undergo ureteroscopy and have a stent will have "stent pain," and this is by far the most common risk/complaint following ureteroscopy. A stent is a soft plastic tube (about half the size of IV tubing) that allows the kidney to drain to the bladder regardless of edema or obstruction. Not only can the stent "rub" on the inside of the bladder, causing a feeling of needing to urinate/overactive bladder, but also the stent allows urine to pass up from the bladder to the kidney during urination - causing symptoms from a warm, tingling sensation to intense pain in the affected flank.   They may be residual stones within the kidney or ureter may be present up to 40% of the time following ureteroscopy, depending on the original stone size and location. These stone fragments will be seen and addressed on follow-up imaging.  Injury to the ureter is the most common intra-operative complication  during ureteroscopy. The reported risk of perforation ranges greatly, depending on whether it is defined as a complete perforation (0.1-0.7% - think of this as a hole through the entire ureter), a partial perforation (1.6% - a hole nearly through the entire ureter), or mucosal tear/scrape (5% - these are similar to a sore on the inside of the mouth). Almost 100% of these will heal with prolonged stenting (anywhere between 2 - 4 weeks). Should a large perforation occur, your urologist may chose to stop the procedure and return on another day when the ureter has had time to heal.   I also explained the risks of general anesthesia, such as: MI, CVA, paralysis, coma and/or death.  - Urinalysis, Complete - CULTURE, URINE COMPREHENSIVE  2. Nephrolithiasis:   Patient also has a right renal stone. He is taking medication by physician in [place] for his stones. I'm not familiar with these medications.  His stone composition is unknown and he has not underwent a metabolic workup.  After this current stone incident, he would like to continue his care in [place].    Return for KUB.  Michiel Cowboy, PA-C  [facility] [address] [place], [place] [address] [phone]

## 2014-12-02 ENCOUNTER — Telehealth: Payer: Self-pay

## 2014-12-02 DIAGNOSIS — N200 Calculus of kidney: Secondary | ICD-10-CM

## 2014-12-02 DIAGNOSIS — N201 Calculus of ureter: Secondary | ICD-10-CM | POA: Insufficient documentation

## 2014-12-02 NOTE — Telephone Encounter (Signed)
IMPRESSION: Moderate right-sided hydronephrosis. No ureteral dilatation is appreciable. There is a questionable calculus in the mid right kidney region, also seen on recent CT. Study otherwise unremarkable.  This was the impression I was able to find.

## 2014-12-02 NOTE — Telephone Encounter (Signed)
LMOM- KUB negative. Will need a RUS.

## 2014-12-02 NOTE — Telephone Encounter (Signed)
Pt returned the call stating that he had a RUS 10/26 and a CT scan 10/21. Both results are in epic. Please advise.

## 2014-12-02 NOTE — Telephone Encounter (Signed)
-----   Message from Harle BattiestShannon A McGowan, PA-C sent at 12/01/2014  1:47 PM EDT ----- Nathaniel BrasStone is not visible on KUB.  He will need a RUS.

## 2014-12-02 NOTE — Telephone Encounter (Signed)
I discussed the results the CT with him at his visit.  He had a KUB that I sent a message on.  It did not demonstrate any stone.  The RUS has not been completed.

## 2014-12-04 LAB — CULTURE, URINE COMPREHENSIVE

## 2014-12-05 NOTE — Telephone Encounter (Signed)
LMOM

## 2014-12-05 NOTE — Telephone Encounter (Signed)
I know he had a CT scan and renal ultrasound already in the emergency room.  We discussed both of these at his appointment.  He had a KUB after he saw me.    The stone is not visible on the KUB.  He needs a renal ultrasound to see if the kidney is still swollen.

## 2014-12-06 NOTE — Telephone Encounter (Signed)
Spoke with pt in reference renal u/s. Pt voiced understanding.

## 2014-12-06 NOTE — Telephone Encounter (Signed)
When is patient's RUS?  I don't see one scheduled.

## 2014-12-06 NOTE — Telephone Encounter (Signed)
Pt stated scheduling has not got in touch with him yet. He was just approved yesterday.

## 2014-12-14 ENCOUNTER — Ambulatory Visit
Admission: RE | Admit: 2014-12-14 | Discharge: 2014-12-14 | Disposition: A | Discharge status: Home / Self Care | Payer: BLUE CROSS/BLUE SHIELD | Source: Ambulatory Visit | Attending: Urology | Admitting: Urology

## 2014-12-14 DIAGNOSIS — K760 Fatty (change of) liver, not elsewhere classified: Secondary | ICD-10-CM | POA: Insufficient documentation

## 2014-12-14 DIAGNOSIS — N200 Calculus of kidney: Secondary | ICD-10-CM | POA: Insufficient documentation

## 2014-12-15 ENCOUNTER — Other Ambulatory Visit: Payer: BLUE CROSS/BLUE SHIELD

## 2014-12-15 ENCOUNTER — Telehealth: Payer: Self-pay

## 2014-12-15 DIAGNOSIS — R3129 Other microscopic hematuria: Secondary | ICD-10-CM

## 2014-12-15 NOTE — Telephone Encounter (Signed)
-----   Message from Harle BattiestShannon A McGowan, PA-C sent at 12/15/2014  9:52 AM EST ----- There is no hydronephrosis seen on RUS.  He will need a repeat UA to confirm the microscopic hematuria has resolved.

## 2014-12-15 NOTE — Telephone Encounter (Signed)
Spoke with pt in reference to RUS results. Pt voiced understanding. Pt will RTC later on today for u/a.

## 2014-12-16 ENCOUNTER — Telehealth: Payer: Self-pay

## 2014-12-16 ENCOUNTER — Other Ambulatory Visit: Payer: Self-pay | Admitting: Urology

## 2014-12-16 DIAGNOSIS — R3129 Other microscopic hematuria: Secondary | ICD-10-CM

## 2014-12-16 LAB — URINALYSIS, COMPLETE
BILIRUBIN UA: NEGATIVE
Glucose, UA: NEGATIVE
Ketones, UA: NEGATIVE
Leukocytes, UA: NEGATIVE
Nitrite, UA: NEGATIVE
PH UA: 5.5 (ref 5.0–7.5)
Protein, UA: NEGATIVE
Specific Gravity, UA: 1.02 (ref 1.005–1.030)
UUROB: 0.2 mg/dL (ref 0.2–1.0)

## 2014-12-16 LAB — MICROSCOPIC EXAMINATION
BACTERIA UA: NONE SEEN
RBC, UA: >30 /hpf — ABNORMAL HIGH (ref 0–?)

## 2014-12-16 NOTE — Telephone Encounter (Signed)
Spoke with pt in reference to CT urogram. Pt stated he is currently taking metformin. Pt also stated he goes back to [place] on the 24th of this month and he wont be back to the [place] For several months.

## 2014-12-16 NOTE — Telephone Encounter (Signed)
-----   Message from Harle BattiestShannon A McGowan, PA-C sent at 12/16/2014  8:36 AM EST ----- Patient still has blood in his urine.  He will need a CT Urogram.  Please confirm he has no allergies to contrast dye, Iodine or shellfish.  Also, make sure he is not on metformin.  I have put the order in for the study.

## 2014-12-19 ENCOUNTER — Ambulatory Visit: Payer: BLUE CROSS/BLUE SHIELD | Admitting: Urology

## 2014-12-19 ENCOUNTER — Telehealth: Payer: Self-pay | Admitting: Radiology

## 2014-12-19 NOTE — Telephone Encounter (Signed)
LMOM for pt to call Centralized Scheduling to schedule CT Urogram.

## 2014-12-21 ENCOUNTER — Other Ambulatory Visit: Payer: Self-pay | Admitting: Urology

## 2014-12-23 ENCOUNTER — Ambulatory Visit
Admission: RE | Admit: 2014-12-23 | Discharge: 2014-12-23 | Disposition: A | Discharge status: Home / Self Care | Payer: BLUE CROSS/BLUE SHIELD | Source: Ambulatory Visit | Attending: Urology | Admitting: Urology

## 2014-12-23 DIAGNOSIS — N201 Calculus of ureter: Secondary | ICD-10-CM | POA: Insufficient documentation

## 2014-12-23 DIAGNOSIS — R3129 Other microscopic hematuria: Secondary | ICD-10-CM | POA: Diagnosis present

## 2014-12-23 MED ORDER — IOHEXOL 300 MG/ML  SOLN
125.0000 mL | Freq: Once | INTRAMUSCULAR | Status: AC | PRN
  Administered 2014-12-23: 125 mL via INTRAVENOUS

## 2014-12-26 ENCOUNTER — Ambulatory Visit (INDEPENDENT_AMBULATORY_CARE_PROVIDER_SITE_OTHER): Payer: BLUE CROSS/BLUE SHIELD | Admitting: Urology

## 2014-12-26 ENCOUNTER — Encounter: Payer: Self-pay | Admitting: Urology

## 2014-12-26 VITALS — BP 146/86 | HR 93 | Ht 72.0 in | Wt 327.7 lb

## 2014-12-26 DIAGNOSIS — N200 Calculus of kidney: Secondary | ICD-10-CM | POA: Diagnosis not present

## 2014-12-26 DIAGNOSIS — R3129 Other microscopic hematuria: Secondary | ICD-10-CM

## 2014-12-26 DIAGNOSIS — N201 Calculus of ureter: Secondary | ICD-10-CM

## 2014-12-26 LAB — URINALYSIS, COMPLETE
Bilirubin, UA: NEGATIVE
Glucose, UA: NEGATIVE
KETONES UA: NEGATIVE
Leukocytes, UA: NEGATIVE
NITRITE UA: NEGATIVE
PH UA: 6 (ref 5.0–7.5)
Protein, UA: NEGATIVE
SPEC GRAV UA: 1.02 (ref 1.005–1.030)
Urobilinogen, Ur: 0.2 mg/dL (ref 0.2–1.0)

## 2014-12-26 LAB — MICROSCOPIC EXAMINATION: Bacteria, UA: NONE SEEN

## 2014-12-26 MED ORDER — ETODOLAC 500 MG PO TABS: 500.0000 mg | ORAL_TABLET | Freq: Two times a day (BID) | ORAL | Status: DC | PRN

## 2014-12-26 MED ORDER — HYDROCODONE-ACETAMINOPHEN 5-325 MG PO TABS: 1.0000 | ORAL_TABLET | Freq: Four times a day (QID) | ORAL | Status: DC | PRN

## 2014-12-26 NOTE — Progress Notes (Signed)
12/26/2014 2:29 PM   Marney Settingavid L Calandro [birth year] [id number]  Referring provider: Wanda PlumpJose E Paz, MD [address] [place], [place] [id number]  Chief Complaint  Patient presents with  . Results    CT urogram    HPI: Patient is a 49 year old white male who presents today for his CT Urogram results.  Patient is not having any pain at this time.  He denies any gross hematuria.  He has not passed a stone.  He continues to have microscopic hematuria.    He is not experiencing any fevers, chills, nausea or vomiting.    CT Urogram demonstrates a migration of the right ureteral calculus from the UPJ to the mid ureter.  He has minimal hydroureter and pelvic caliectasis on the right.  He has an additional 9 mm right ureteral calculus.  Punctate left renal calculus.  I have reviewed the films with the patient.     PMH: Past Medical History  Diagnosis Date  . Asthma     ? of asthma   . Headache(784.0)     intense on-off, ibuprofen helps   . Seasonal allergies   . Hearing loss aprox. 2007    low tone decreased , R side, w/u neg per ENT  . Diabetes mellitus   . Urolithiasis     while in [place] 09-2013  . Diabetes mellitus (HCC)   . Type 2 diabetes mellitus (HCC) 12/01/2014  . General medical examination 12/25/2010  . Advice or immunization for travel 05/21/2011  . Gout 12/01/2014  . Adiposity 12/01/2014  . Microscopic hematuria     Surgical History: Past Surgical History  Procedure Laterality Date  . Knee surgery  93    carthilage  repair post injury , R  . Elbow surgery Left 2012    cubital tunnel syndrome   . Wisdom tooth extraction      Home Medications:    Medication List       This list is accurate as of: 12/26/14  2:29 PM.  Always use your most recent med list.               allopurinol 300 MG tablet  Commonly known as:  ZYLOPRIM  Take 1 tablet (300 mg total) by mouth daily.     aspirin EC 325 MG tablet  Take 325 mg by mouth daily.     etodolac  500 MG tablet  Commonly known as:  LODINE  Take 1 tablet (500 mg total) by mouth 2 (two) times daily as needed.     glucosamine-chondroitin 500-400 MG tablet  Take 1 tablet by mouth 2 (two) times daily.     HYDROcodone-acetaminophen 5-325 MG tablet  Commonly known as:  NORCO/VICODIN  Take 1 tablet by mouth every 6 (six) hours as needed for moderate pain.     metFORMIN 1000 MG tablet  Commonly known as:  GLUCOPHAGE  Take 1 tablet (1,000 mg total) by mouth 2 (two) times daily with a meal.     multivitamin with minerals Tabs tablet  Take 1 tablet by mouth daily.     ondansetron 4 MG disintegrating tablet  Commonly known as:  ZOFRAN ODT  Take 1 tablet (4 mg total) by mouth every 6 (six) hours as needed for nausea or vomiting.     tamsulosin 0.4 MG Caps capsule  Commonly known as:  FLOMAX  Take 1 capsule (0.4 mg total) by mouth daily.     UNABLE TO FIND  Med  Name: Cystone / Fulton Reek TO FIND  Med Name: Renalof (Catalysis)     UNABLE TO FIND  Med Name: Rowatinex (Kapsein)        Allergies:  Allergies  Allergen Reactions  . Codeine Nausea And Vomiting    Family History: Family History  Problem Relation Age of Onset  . Rheum arthritis Father   . Breast cancer Mother   . Prostate cancer Father 11  . Stroke Mother   . AAA (abdominal aortic aneurysm) Father     AAA father dx age 10 aproxand GF  . Colon cancer Neg Hx   . Colon polyps Father     dx in his 77s  . Coronary artery disease Neg Hx   . Diabetes      GF?  Marland Kitchen Dementia Mother   . AAA (abdominal aortic aneurysm) Sister     Social History:  reports that he has never smoked. He has never used smokeless tobacco. He reports that he drinks alcohol. He reports that he does not use illicit drugs.  ROS: UROLOGY Frequent Urination?: No Hard to postpone urination?: No Burning/pain with urination?: No Get up at night to urinate?: No Leakage of urine?: No Urine stream starts and stops?: No Trouble  starting stream?: No Do you have to strain to urinate?: No Blood in urine?: Yes Urinary tract infection?: No Sexually transmitted disease?: No Injury to kidneys or bladder?: No Painful intercourse?: No Weak stream?: No Erection problems?: No Penile pain?: No  Gastrointestinal Nausea?: No Vomiting?: No Indigestion/heartburn?: No Diarrhea?: No Constipation?: No  Constitutional Fever: No Night sweats?: No Weight loss?: No Fatigue?: No  Skin Skin rash/lesions?: No Itching?: No  Eyes Blurred vision?: No Double vision?: No  Ears/Nose/Throat Sore throat?: No Sinus problems?: No  Hematologic/Lymphatic Swollen glands?: No Easy bruising?: No  Cardiovascular Leg swelling?: No Chest pain?: No  Respiratory Cough?: No Shortness of breath?: No  Endocrine Excessive thirst?: No  Musculoskeletal Back pain?: No Joint pain?: No  Neurological Headaches?: No Dizziness?: No  Psychologic Depression?: No Anxiety?: No  Physical Exam: BP 146/86 mmHg  Pulse 93  Ht 6' (1.829 m)  Wt 327 lb 11.2 oz (148.644 kg)  BMI 44.43 kg/m2  Constitutional: Well nourished. Alert and oriented, No acute distress. HEENT: [place] AT, moist mucus membranes. Trachea midline, no masses. Cardiovascular: No clubbing, cyanosis, or edema. Respiratory: Normal respiratory effort, no increased work of breathing. GI: Abdomen is soft, non tender, non distended, no abdominal masses. Liver and spleen not palpable.  No hernias appreciated.  Stool sample for occult testing is not indicated.   GU: No CVA tenderness.  No bladder fullness or masses.   Skin: No rashes, bruises or suspicious lesions. Lymph: No cervical or inguinal adenopathy. Neurologic: Grossly intact, no focal deficits, moving all 4 extremities. Psychiatric: Normal mood and affect.  Laboratory Data: Lab Results  Component Value Date   WBC 14.8* 11/25/2014   HGB 14.7 11/25/2014   HCT 43.2 11/25/2014   MCV 92.7 11/25/2014   PLT 234  11/25/2014    Lab Results  Component Value Date   CREATININE 1.35* 11/25/2014    Lab Results  Component Value Date   PSA 0.63 09/22/2014    No results found for: TESTOSTERONE  Lab Results  Component Value Date   HGBA1C 7.7* 09/22/2014    Urinalysis Results for orders placed or performed in visit on 12/26/14  Microscopic Examination  Result Value Ref Range   WBC, UA 0-5 0 -  5 /hpf   RBC, UA 3-10 (A) 0 -  2 /hpf   Epithelial Cells (non renal) 0-10 0 - 10 /hpf   Bacteria, UA None seen None seen/Few  Urinalysis, Complete  Result Value Ref Range   Specific Gravity, UA 1.020 1.005 - 1.030   pH, UA 6.0 5.0 - 7.5   Color, UA Yellow Yellow   Appearance Ur Clear Clear   Leukocytes, UA Negative Negative   Protein, UA Negative Negative/Trace   Glucose, UA Negative Negative   Ketones, UA Negative Negative   RBC, UA 1+ (A) Negative   Bilirubin, UA Negative Negative   Urobilinogen, Ur 0.2 0.2 - 1.0 mg/dL   Nitrite, UA Negative Negative   Microscopic Examination See below:     Pertinent Imaging: CLINICAL DATA: RIGHT flank pain for about 3 weeks. Microscopic hematuria.  EXAM: CT ABDOMEN AND PELVIS WITHOUT AND WITH CONTRAST  TECHNIQUE: Multidetector CT imaging of the abdomen and pelvis was performed following the standard protocol before and following the bolus administration of intravenous contrast.  CONTRAST: OMNIPAQUE IOHEXOL 300 MG/ML SOLN  COMPARISON: CT 11/25/2014  FINDINGS: Lower chest: Lung bases are clear.  Hepatobiliary: Low-attenuation within liver suggests hepatic steatosis. No focal hepatic lesion. Normal gallbladder.  Pancreas: Pancreas is normal. No ductal dilatation. No pancreatic inflammation.  Spleen: Normal spleen  Adrenals/urinary tract: Adrenal glands normal.  Non IV contrast images demonstrate a 1 mm LEFT upper pole renal calculus. Nonobstructing 9 mm RIGHT renal calculus. There is a calculus along the course of the  RIGHT ureter measuring 6 mm on image 68, series 2. This is new from comparison exam of 10/12 09/05/2014 and therefore is likely a ureteral calculus. This appears to have migrated inferiorly from the calculus seen at the ureteropelvic junction on the RIGHT on comparison exam. Minimal hydroureter on the RIGHT. No bladder calculi.  Cortical phase imaging demonstrates no enhancing renal cortical lesion. There is mild pelvicaliectasis on the RIGHT. Delayed pyelogram phase imaging demonstrates no filling defects within the collecting systems. There is poor opacification of the distal RIGHT ureter beyond the ureteral calcification.  No bladder calculi, enhancing bladder lesions, or filling defect within the bladder.  Stomach/Bowel: Stomach, small bowel, appendix, and cecum are normal. The colon and rectosigmoid colon are normal.  Vascular/Lymphatic: Abdominal aorta is normal caliber. There is no retroperitoneal or periportal lymphadenopathy. No pelvic lymphadenopathy.  Reproductive: Prostate gland normal  Other: No free fluid.  Musculoskeletal: No aggressive osseous lesion.  IMPRESSION: 1. Migration of all the RIGHT ureteral calculus from the ureteropelvic junction to the mid ureter. Minimal hydroureter and pelvic caliectasis on the RIGHT. 2. Additional 9 mm RIGHT ureteral calculus. Punctate LEFT renal calculus.   Electronically Signed  By: Genevive Bi M.D.  On: 12/23/2014 09:09   Assessment & Plan:    1. Right ureteral stone:    Patient with a right mid ureteral stone and causing minimal hydroureter and pelvic caliectasis.  Patient is going back to [place] on Thursday.  He will follow up with his urologist there.  He is due to return in February and will contact us when [place].  I have given him refills on his etodolac and Vicodin to carry him to his next appointment.    2.Microscopic hematuria:   Patient will follow up in February when he  [place]  He will return for an update on his stones and to check an UA for hematuria.    - Urinalysis,  Complete - CULTURE, URINE COMPREHENSIVE  3. Nephrolithiasis:   Patient with bilateral stones.  He wants to have these addressed in [place].  We will address this issue again when he returns in February.    Return for in February.  Michiel Cowboy, PA-C  [facility] [address] [place], [place] [phone]

## 2014-12-28 LAB — CULTURE, URINE COMPREHENSIVE

## 2015-03-10 ENCOUNTER — Ambulatory Visit (INDEPENDENT_AMBULATORY_CARE_PROVIDER_SITE_OTHER): Payer: BLUE CROSS/BLUE SHIELD | Admitting: Urology

## 2015-03-10 ENCOUNTER — Encounter: Payer: Self-pay | Admitting: Urology

## 2015-03-10 ENCOUNTER — Encounter: Payer: Self-pay | Admitting: *Deleted

## 2015-03-10 VITALS — BP 144/89 | HR 82 | Ht 72.0 in | Wt 320.1 lb

## 2015-03-10 DIAGNOSIS — R3129 Other microscopic hematuria: Secondary | ICD-10-CM

## 2015-03-10 DIAGNOSIS — N201 Calculus of ureter: Secondary | ICD-10-CM

## 2015-03-10 DIAGNOSIS — N200 Calculus of kidney: Secondary | ICD-10-CM | POA: Diagnosis not present

## 2015-03-10 LAB — URINALYSIS, COMPLETE
Bilirubin, UA: NEGATIVE
GLUCOSE, UA: NEGATIVE
Ketones, UA: NEGATIVE
Leukocytes, UA: NEGATIVE
Nitrite, UA: NEGATIVE
PH UA: 5 (ref 5.0–7.5)
PROTEIN UA: NEGATIVE
Specific Gravity, UA: 1.02 (ref 1.005–1.030)
UUROB: 0.2 mg/dL (ref 0.2–1.0)

## 2015-03-10 LAB — MICROSCOPIC EXAMINATION

## 2015-03-10 MED ORDER — TAMSULOSIN HCL 0.4 MG PO CAPS: 0.4000 mg | ORAL_CAPSULE | Freq: Every day | ORAL | Status: DC

## 2015-03-10 NOTE — Progress Notes (Signed)
11:07 AM   Nathaniel Luna 1965-07-28 [id number]  Referring provider: Wanda Plump, MD [address] [place], [place] [address]  Chief Complaint  Patient presents with  . Hematuria    microscopic 3 month follow up  . Nephrolithiasis    HPI: Patient is a 50 year old Caucasian male with a history of microscopic hematuria and nephrolithiasis who presents today for follow-up after returning from [place].   Previous history Patient was seen in the emergency room on 11/25/2014 for a 9 mm proximal right ureteral stone with obstructive changes.  Patient has a history of nephrolithiasis and takes medications prescribed to him by a doctor in [place] for his stones. I'm not familiar with these medications nor do I know his stone composition's. He states he's never had to undergo surgery for stones and is passed them spontaneously. He has not had a metabolic workup.  CT Urogram on 12/23/2014 demonstrates a migration of the right ureteral calculus from the UPJ to the mid ureter.  He has minimal hydroureter and pelvic caliectasis on the right.  He has an additional 9 mm right ureteral calculus.  Punctate left renal calculus.  Patient then left for [place].  Today, the patient states he has not had any symptoms of renal colic or hematuria.  He has not passed a stone.  His UA today demonstrates 11-30RBC's/hpf.  He has not had any fevers, chills, nausea or vomiting.     PMH: Past Medical History  Diagnosis Date  . Asthma     ? of asthma   . Headache(784.0)     intense on-off, ibuprofen helps   . Seasonal allergies   . Hearing loss aprox. 2007    low tone decreased , R side, w/u neg per ENT  . Diabetes mellitus   . Urolithiasis     while in [place] 09-2013  . Diabetes mellitus (HCC)   . Type 2 diabetes mellitus (HCC) 12/01/2014  . General medical examination 12/25/2010  . Advice or immunization for travel 05/21/2011  . Gout 12/01/2014  . Adiposity 12/01/2014  .  Microscopic hematuria     Surgical History: Past Surgical History  Procedure Laterality Date  . Knee surgery  93    carthilage  repair post injury , R  . Elbow surgery Left 2012    cubital tunnel syndrome   . Wisdom tooth extraction      Home Medications:    Medication List       This list is accurate as of: 03/10/15 11:59 PM.  Always use your most recent med list.               allopurinol 300 MG tablet  Commonly known as:  ZYLOPRIM  Take 1 tablet (300 mg total) by mouth daily.     aspirin EC 325 MG tablet  Take 325 mg by mouth daily.     etodolac 500 MG tablet  Commonly known as:  LODINE  Take 1 tablet (500 mg total) by mouth 2 (two) times daily as needed.     GLUCOSAMINE CHONDR 1500 COMPLX PO  Take by mouth.     HYDROcodone-acetaminophen 5-325 MG tablet  Commonly known as:  NORCO/VICODIN  Take 1 tablet by mouth every 6 (six) hours as needed for moderate pain.     metFORMIN 1000 MG tablet  Commonly known as:  GLUCOPHAGE  Take 1 tablet (1,000 mg total) by mouth 2 (two) times daily with a meal.  multivitamin with minerals Tabs tablet  Take 1 tablet by mouth daily.     ondansetron 4 MG disintegrating tablet  Commonly known as:  ZOFRAN ODT  Take 1 tablet (4 mg total) by mouth every 6 (six) hours as needed for nausea or vomiting.     tamsulosin 0.4 MG Caps capsule  Commonly known as:  FLOMAX  Take 1 capsule (0.4 mg total) by mouth daily.     UNABLE TO FIND  Med Name: Cystone / Fulton Reek TO FIND  Med Name: Renalof (Catalysis)     UNABLE TO FIND  Med Name: Rowatinex (Kapsein)        Allergies:  Allergies  Allergen Reactions  . Codeine Nausea And Vomiting    Family History: Family History  Problem Relation Age of Onset  . Rheum arthritis Father   . Breast cancer Mother   . Prostate cancer Father 63  . Stroke Mother   . AAA (abdominal aortic aneurysm) Father     AAA father dx age 54 aproxand GF  . Colon cancer Neg Hx   . Colon  polyps Father     dx in his 1s  . Coronary artery disease Neg Hx   . Diabetes      GF?  Marland Kitchen Dementia Mother   . AAA (abdominal aortic aneurysm) Sister     Social History:  reports that he has never smoked. He has never used smokeless tobacco. He reports that he drinks alcohol. He reports that he does not use illicit drugs.  ROS: UROLOGY Frequent Urination?: No Hard to postpone urination?: No Burning/pain with urination?: No Get up at night to urinate?: No Leakage of urine?: No Urine stream starts and stops?: No Trouble starting stream?: No Do you have to strain to urinate?: No Blood in urine?: No Urinary tract infection?: No Sexually transmitted disease?: No Injury to kidneys or bladder?: No Painful intercourse?: No Weak stream?: No Erection problems?: No Penile pain?: No  Gastrointestinal Nausea?: No Vomiting?: No Indigestion/heartburn?: No Diarrhea?: No Constipation?: No  Constitutional Fever: No Night sweats?: No Weight loss?: No Fatigue?: No  Skin Skin rash/lesions?: No Itching?: No  Eyes Blurred vision?: No Double vision?: No  Ears/Nose/Throat Sore throat?: No Sinus problems?: No  Hematologic/Lymphatic Swollen glands?: No Easy bruising?: No  Cardiovascular Leg swelling?: No Chest pain?: No  Respiratory Cough?: No Shortness of breath?: No  Endocrine Excessive thirst?: No  Musculoskeletal Back pain?: No Joint pain?: No  Neurological Headaches?: No Dizziness?: No  Psychologic Depression?: No Anxiety?: No  Physical Exam: BP 144/89 mmHg  Pulse 82  Ht 6' (1.829 m)  Wt 320 lb 1.6 oz (145.196 kg)  BMI 43.40 kg/m2  Constitutional: Well nourished. Obese.  Alert and oriented, No acute distress. HEENT: [place] AT, moist mucus membranes. Trachea midline, no masses. Cardiovascular: No clubbing, cyanosis, or edema. Respiratory: Normal respiratory effort, no increased work of breathing. GI: Abdomen is soft, non tender, non distended, no  abdominal masses. Liver and spleen not palpable.  No hernias appreciated.  Stool sample for occult testing is not indicated.   GU: No CVA tenderness.  No bladder fullness or masses.  Patient with circumcised phallus.   Urethral meatus is patent.  No penile discharge. No penile lesions or rashes. Scrotum without lesions, cysts, rashes and/or edema.  Testicles are located scrotally bilaterally. No masses are appreciated in the testicles. Left and right epididymis are normal. Rectal: Patient with  normal sphincter tone. Anus and perineum without scarring or  rashes. No rectal masses are appreciated. Prostate is approximately 45 grams, no nodules are appreciated. Seminal vesicles are normal. Skin: No rashes, bruises or suspicious lesions. Lymph: No cervical or inguinal adenopathy. Neurologic: Grossly intact, no focal deficits, moving all 4 extremities. Psychiatric: Normal mood and affect.  Laboratory Data: Lab Results  Component Value Date   WBC 14.8* 11/25/2014   HGB 14.7 11/25/2014   HCT 43.2 11/25/2014   MCV 92.7 11/25/2014   PLT 234 11/25/2014    Lab Results  Component Value Date   CREATININE 1.35* 11/25/2014    Lab Results  Component Value Date   PSA 0.63 09/22/2014    Lab Results  Component Value Date   HGBA1C 7.7* 09/22/2014    Urinalysis Results for orders placed or performed in visit on 03/10/15  Microscopic Examination  Result Value Ref Range   WBC, UA 0-5 0 -  5 /hpf   RBC, UA 11-30 (A) 0 -  2 /hpf   Epithelial Cells (non renal) 0-10 0 - 10 /hpf   Mucus, UA Present (A) Not Estab.   Bacteria, UA Few None seen/Few  Urinalysis, Complete  Result Value Ref Range   Specific Gravity, UA 1.020 1.005 - 1.030   pH, UA 5.0 5.0 - 7.5   Color, UA Yellow Yellow   Appearance Ur Clear Clear   Leukocytes, UA Negative Negative   Protein, UA Negative Negative/Trace   Glucose, UA Negative Negative   Ketones, UA Negative Negative   RBC, UA 3+ (A) Negative   Bilirubin, UA  Negative Negative   Urobilinogen, Ur 0.2 0.2 - 1.0 mg/dL   Nitrite, UA Negative Negative   Microscopic Examination See below:     Pertinent Imaging: CLINICAL DATA: RIGHT flank pain for about 3 weeks. Microscopic hematuria.  EXAM: CT ABDOMEN AND PELVIS WITHOUT AND WITH CONTRAST  TECHNIQUE: Multidetector CT imaging of the abdomen and pelvis was performed following the standard protocol before and following the bolus administration of intravenous contrast.  CONTRAST: OMNIPAQUE IOHEXOL 300 MG/ML SOLN  COMPARISON: CT 11/25/2014  FINDINGS: Lower chest: Lung bases are clear.  Hepatobiliary: Low-attenuation within liver suggests hepatic steatosis. No focal hepatic lesion. Normal gallbladder.  Pancreas: Pancreas is normal. No ductal dilatation. No pancreatic inflammation.  Spleen: Normal spleen  Adrenals/urinary tract: Adrenal glands normal.  Non IV contrast images demonstrate a 1 mm LEFT upper pole renal calculus. Nonobstructing 9 mm RIGHT renal calculus. There is a calculus along the course of the RIGHT ureter measuring 6 mm on image 68, series 2. This is new from comparison exam of 10/12 09/05/2014 and therefore is likely a ureteral calculus. This appears to have migrated inferiorly from the calculus seen at the ureteropelvic junction on the RIGHT on comparison exam. Minimal hydroureter on the RIGHT. No bladder calculi.  Cortical phase imaging demonstrates no enhancing renal cortical lesion. There is mild pelvicaliectasis on the RIGHT. Delayed pyelogram phase imaging demonstrates no filling defects within the collecting systems. There is poor opacification of the distal RIGHT ureter beyond the ureteral calcification.  No bladder calculi, enhancing bladder lesions, or filling defect within the bladder.  Stomach/Bowel: Stomach, small bowel, appendix, and cecum are normal. The colon and rectosigmoid colon are normal.  Vascular/Lymphatic:  Abdominal aorta is normal caliber. There is no retroperitoneal or periportal lymphadenopathy. No pelvic lymphadenopathy.  Reproductive: Prostate gland normal  Other: No free fluid.  Musculoskeletal: No aggressive osseous lesion.  IMPRESSION: 1. Migration of all the RIGHT ureteral calculus from the ureteropelvic junction to the  mid ureter. Minimal hydroureter and pelvic caliectasis on the RIGHT. 2. Additional 9 mm RIGHT ureteral calculus. Punctate LEFT renal calculus.   Electronically Signed  By: Genevive Bi M.D.  On: 12/23/2014 09:09   Assessment & Plan:    1. Right ureteral stone:    Patient with a right mid ureteral stone and causing minimal hydroureter and pelvic caliectasis.  Patient has returned from [place].   He has not passed a fragment and the status of the stone is unknown at this time.  I will schedule him for a KUB and RUS.  He will RTC to discuss those results.    2.Microscopic hematuria:   Patient continues to have microscopic hematuria.  He did complete a CT Urogram in 12/2014.  He will still need a cystoscopy at one point.  If he still has a right ureteral stone, we can complete the cystoscopy during the stone extraction.  - Urinalysis, Complete  3. Nephrolithiasis:   Patient with bilateral stones.  He is schedule for a KUB and RUS.    Return for KUB and RUS report .  Michiel Cowboy, PA-C  [facility] [address], [address] [place], [place] [address] [phone]

## 2015-03-14 ENCOUNTER — Telehealth: Payer: Self-pay | Admitting: Internal Medicine

## 2015-03-14 DIAGNOSIS — Z87442 Personal history of urinary calculi: Secondary | ICD-10-CM | POA: Insufficient documentation

## 2015-03-14 NOTE — Telephone Encounter (Signed)
lvm inquiring if patient received flu shot  °

## 2015-03-15 DIAGNOSIS — E7800 Pure hypercholesterolemia, unspecified: Secondary | ICD-10-CM | POA: Insufficient documentation

## 2015-03-15 DIAGNOSIS — R779 Abnormality of plasma protein, unspecified: Secondary | ICD-10-CM | POA: Insufficient documentation

## 2015-03-15 DIAGNOSIS — D729 Disorder of white blood cells, unspecified: Secondary | ICD-10-CM | POA: Insufficient documentation

## 2015-03-20 ENCOUNTER — Ambulatory Visit
Admission: RE | Admit: 2015-03-20 | Discharge: 2015-03-20 | Disposition: A | Discharge status: Home / Self Care | Payer: BLUE CROSS/BLUE SHIELD | Source: Ambulatory Visit | Attending: Urology | Admitting: Urology

## 2015-03-20 DIAGNOSIS — R3129 Other microscopic hematuria: Secondary | ICD-10-CM | POA: Insufficient documentation

## 2015-03-20 DIAGNOSIS — N200 Calculus of kidney: Secondary | ICD-10-CM | POA: Diagnosis not present

## 2015-03-27 ENCOUNTER — Ambulatory Visit (INDEPENDENT_AMBULATORY_CARE_PROVIDER_SITE_OTHER): Payer: BLUE CROSS/BLUE SHIELD | Admitting: Urology

## 2015-03-27 ENCOUNTER — Encounter: Payer: Self-pay | Admitting: Urology

## 2015-03-27 VITALS — BP 142/88 | HR 97 | Ht 72.0 in | Wt 321.3 lb

## 2015-03-27 DIAGNOSIS — R3129 Other microscopic hematuria: Secondary | ICD-10-CM | POA: Diagnosis not present

## 2015-03-27 DIAGNOSIS — N200 Calculus of kidney: Secondary | ICD-10-CM | POA: Diagnosis not present

## 2015-03-27 DIAGNOSIS — N201 Calculus of ureter: Secondary | ICD-10-CM | POA: Diagnosis not present

## 2015-03-27 LAB — URINALYSIS, COMPLETE
BILIRUBIN UA: NEGATIVE
GLUCOSE, UA: NEGATIVE
KETONES UA: NEGATIVE
LEUKOCYTES UA: NEGATIVE
NITRITE UA: NEGATIVE
Protein, UA: NEGATIVE
SPEC GRAV UA: 1.02 (ref 1.005–1.030)
Urobilinogen, Ur: 0.2 mg/dL (ref 0.2–1.0)
pH, UA: 5.5 (ref 5.0–7.5)

## 2015-03-27 LAB — MICROSCOPIC EXAMINATION: BACTERIA UA: NONE SEEN

## 2015-03-27 NOTE — Progress Notes (Signed)
10:08 AM   Nathaniel Luna 21-Dec-1965 [id number]  Referring provider: Wanda Plump, MD [address] [place], [place] [address]  Chief Complaint  Patient presents with  . Results    KUB and [place]    HPI: Patient is a 50 year old Caucasian male with a history of microscopic hematuria and nephrolithiasis who presents today for  KUB and  renal ultrasound results.    Previous history Patient was seen in the emergency room on 11/25/2014 for a 9 mm proximal right ureteral stone with obstructive changes.  Patient has a history of nephrolithiasis and takes medications prescribed to him by a doctor in [place] for his stones. I'm not familiar with these medications nor do I know his stone composition's. He states he's never had to undergo surgery for stones and is passed them spontaneously. He has not had a metabolic workup.  CT Urogram on 12/23/2014 demonstrates a migration of the right ureteral calculus from the UPJ to the mid ureter.  He has minimal hydroureter and pelvic caliectasis on the right.  He has an additional 9 mm right ureteral calculus.  Punctate left renal calculus.  Patient then left for [place].  Today, the patient states he has not had any symptoms of renal colic or hematuria.  He has not passed a stone.  His UA today demonstrates 11-30RBC's/hpf.  He has not had any fevers, chills, nausea or vomiting.    KUB completed on 03/19/2105 noted a large stool burden.  RUS completed on 03/20/2015 noted a 13 mm right upper pole renal calculus.  I have reviewed the films.  PMH: Past Medical History  Diagnosis Date  . Asthma     ? of asthma   . Headache(784.0)     intense on-off, ibuprofen helps   . Seasonal allergies   . Hearing loss aprox. 2007    low tone decreased , R side, w/u neg per ENT  . Diabetes mellitus   . Urolithiasis     while in [place] 09-2013  . Diabetes mellitus (HCC)   . Type 2 diabetes mellitus (HCC) 12/01/2014  . General medical examination  12/25/2010  . Advice or immunization for travel 05/21/2011  . Gout 12/01/2014  . Adiposity 12/01/2014  . Microscopic hematuria     Surgical History: Past Surgical History  Procedure Laterality Date  . Knee surgery  93    carthilage  repair post injury , R  . Elbow surgery Left 2012    cubital tunnel syndrome   . Wisdom tooth extraction      Home Medications:    Medication List       This list is accurate as of: 03/27/15 10:07 AM.  Always use your most recent med list.               allopurinol 300 MG tablet  Commonly known as:  ZYLOPRIM  Take 1 tablet (300 mg total) by mouth daily.     aspirin EC 325 MG tablet  Take 325 mg by mouth daily. Reported on 03/27/2015     atorvastatin 10 MG tablet  Commonly known as:  LIPITOR  Take by mouth.     CELEBREX 200 MG capsule  Generic drug:  celecoxib  Take by mouth. Reported on 03/27/2015     etodolac 500 MG tablet  Commonly known as:  LODINE  Take 1 tablet (500 mg total) by mouth 2 (two) times daily as needed.     glipiZIDE 5  MG 24 hr tablet  Commonly known as:  GLUCOTROL XL  Take by mouth.     GLUCOSAMINE CHONDR 1500 COMPLX PO  Take by mouth. Reported on 03/27/2015     HYDROcodone-acetaminophen 5-325 MG tablet  Commonly known as:  NORCO/VICODIN  Take 1 tablet by mouth every 6 (six) hours as needed for moderate pain.     metFORMIN 1000 MG tablet  Commonly known as:  GLUCOPHAGE  Take 1 tablet (1,000 mg total) by mouth 2 (two) times daily with a meal.     multivitamin with minerals Tabs tablet  Take 1 tablet by mouth daily.     ondansetron 4 MG disintegrating tablet  Commonly known as:  ZOFRAN ODT  Take 1 tablet (4 mg total) by mouth every 6 (six) hours as needed for nausea or vomiting.     tamsulosin 0.4 MG Caps capsule  Commonly known as:  FLOMAX  Take 1 capsule (0.4 mg total) by mouth daily.     UNABLE TO FIND  Med Name: Cystone / [place] Reek TO FIND  Med Name: Renalof (Catalysis)     UNABLE TO  FIND  Med Name: Rowatinex (Kapsein)        Allergies:  Allergies  Allergen Reactions  . Codeine Nausea And Vomiting    Family History: Family History  Problem Relation Age of Onset  . Rheum arthritis Father   . Breast cancer Mother   . Prostate cancer Father 65  . Stroke Mother   . AAA (abdominal aortic aneurysm) Father     AAA father dx age 5 aproxand GF  . Colon cancer Neg Hx   . Colon polyps Father     dx in his 69s  . Coronary artery disease Neg Hx   . Diabetes      GF?  Marland Kitchen Dementia Mother   . AAA (abdominal aortic aneurysm) Sister     Social History:  reports that he has never smoked. He has never used smokeless tobacco. He reports that he drinks alcohol. He reports that he does not use illicit drugs.  ROS: UROLOGY Frequent Urination?: No Hard to postpone urination?: No Burning/pain with urination?: No Get up at night to urinate?: No Leakage of urine?: No Urine stream starts and stops?: No Trouble starting stream?: No Do you have to strain to urinate?: No Blood in urine?: No Urinary tract infection?: No Sexually transmitted disease?: No Injury to kidneys or bladder?: No Painful intercourse?: No Weak stream?: No Erection problems?: No Penile pain?: No  Gastrointestinal Nausea?: No Vomiting?: No Indigestion/heartburn?: No Diarrhea?: No Constipation?: No  Constitutional Fever: No Night sweats?: No Weight loss?: No Fatigue?: No  Skin Skin rash/lesions?: No Itching?: No  Eyes Blurred vision?: No Double vision?: No  Ears/Nose/Throat Sore throat?: No Sinus problems?: No  Hematologic/Lymphatic Swollen glands?: No Easy bruising?: No  Cardiovascular Leg swelling?: No Chest pain?: No  Respiratory Cough?: No Shortness of breath?: No  Endocrine Excessive thirst?: No  Musculoskeletal Back pain?: No Joint pain?: No  Neurological Headaches?: No Dizziness?: No  Psychologic Depression?: No Anxiety?: No  Physical Exam: BP  142/88 mmHg  Pulse 97  Ht 6' (1.829 m)  Wt 321 lb 4.8 oz (145.741 kg)  BMI 43.57 kg/m2  Constitutional: Well nourished. Obese.  Alert and oriented, No acute distress. HEENT: [place] AT, moist mucus membranes. Trachea midline, no masses. Cardiovascular: No clubbing, cyanosis, or edema. Respiratory: Normal respiratory effort, no increased work of breathing. Skin: No rashes, bruises or suspicious  lesions. Lymph: No cervical or inguinal adenopathy. Neurologic: Grossly intact, no focal deficits, moving all 4 extremities. Psychiatric: Normal mood and affect.  Laboratory Data: Lab Results  Component Value Date   WBC 14.8* 11/25/2014   HGB 14.7 11/25/2014   HCT 43.2 11/25/2014   MCV 92.7 11/25/2014   PLT 234 11/25/2014    Lab Results  Component Value Date   CREATININE 1.35* 11/25/2014    Lab Results  Component Value Date   PSA 0.63 09/22/2014    Lab Results  Component Value Date   HGBA1C 7.7* 09/22/2014    Urinalysis Results for orders placed or performed in visit on 03/27/15  Microscopic Examination  Result Value Ref Range   WBC, UA 0-5 0 -  5 /hpf   RBC, UA 3-10 (A) 0 -  2 /hpf   Epithelial Cells (non renal) 0-10 0 - 10 /hpf   Mucus, UA Present (A) Not Estab.   Bacteria, UA None seen None seen/Few  Urinalysis, Complete  Result Value Ref Range   Specific Gravity, UA 1.020 1.005 - 1.030   pH, UA 5.5 5.0 - 7.5   Color, UA Yellow Yellow   Appearance Ur Clear Clear   Leukocytes, UA Negative Negative   Protein, UA Negative Negative/Trace   Glucose, UA Negative Negative   Ketones, UA Negative Negative   RBC, UA 2+ (A) Negative   Bilirubin, UA Negative Negative   Urobilinogen, Ur 0.2 0.2 - 1.0 mg/dL   Nitrite, UA Negative Negative   Microscopic Examination See below:     Pertinent Imaging: CLINICAL DATA: RIGHT flank pain for about 3 weeks. Microscopic hematuria.  EXAM: CT ABDOMEN AND PELVIS WITHOUT AND WITH CONTRAST  TECHNIQUE: Multidetector CT imaging of  the abdomen and pelvis was performed following the standard protocol before and following the bolus administration of intravenous contrast.  CONTRAST: OMNIPAQUE IOHEXOL 300 MG/ML SOLN  COMPARISON: CT 11/25/2014  FINDINGS: Lower chest: Lung bases are clear.  Hepatobiliary: Low-attenuation within liver suggests hepatic steatosis. No focal hepatic lesion. Normal gallbladder.  Pancreas: Pancreas is normal. No ductal dilatation. No pancreatic inflammation.  Spleen: Normal spleen  Adrenals/urinary tract: Adrenal glands normal.  Non IV contrast images demonstrate a 1 mm LEFT upper pole renal calculus. Nonobstructing 9 mm RIGHT renal calculus. There is a calculus along the course of the RIGHT ureter measuring 6 mm on image 68, series 2. This is new from comparison exam of 10/12 09/05/2014 and therefore is likely a ureteral calculus. This appears to have migrated inferiorly from the calculus seen at the ureteropelvic junction on the RIGHT on comparison exam. Minimal hydroureter on the RIGHT. No bladder calculi.  Cortical phase imaging demonstrates no enhancing renal cortical lesion. There is mild pelvicaliectasis on the RIGHT. Delayed pyelogram phase imaging demonstrates no filling defects within the collecting systems. There is poor opacification of the distal RIGHT ureter beyond the ureteral calcification.  No bladder calculi, enhancing bladder lesions, or filling defect within the bladder.  Stomach/Bowel: Stomach, small bowel, appendix, and cecum are normal. The colon and rectosigmoid colon are normal.  Vascular/Lymphatic: Abdominal aorta is normal caliber. There is no retroperitoneal or periportal lymphadenopathy. No pelvic lymphadenopathy.  Reproductive: Prostate gland normal  Other: No free fluid.  Musculoskeletal: No aggressive osseous lesion.  IMPRESSION: 1. Migration of all the RIGHT ureteral calculus from the ureteropelvic junction to  the mid ureter. Minimal hydroureter and pelvic caliectasis on the RIGHT. 2. Additional 9 mm RIGHT ureteral calculus. Punctate LEFT renal calculus.   Electronically  Signed  By: Genevive Bi M.D.  On: 12/23/2014 09:09  CLINICAL DATA: Hematuria. History of kidney stone.  EXAM: ABDOMEN - 1 VIEW  COMPARISON: CT 12/23/2014  FINDINGS: Large stool burden throughout the colon. Nonobstructive bowel gas pattern. No free air organomegaly. No acute bony abnormality. Degenerative changes and slight scoliosis in the lumbar spine.  IMPRESSION: Large stool burden. No acute findings.   Electronically Signed  By: Charlett Nose M.D.  On: 03/20/2015 14:47  CLINICAL DATA: Microscopic hematuria, history of kidney stones  EXAM: RENAL / URINARY TRACT ULTRASOUND COMPLETE  COMPARISON: CT abdomen pelvis dated 12/23/2014  FINDINGS: Right Kidney:  Length: 11.9 cm. 13 mm calculus in the lateral upper pole. No hydronephrosis.  Left Kidney:  Length: 12.5 cm. No mass or hydronephrosis.  Bladder:  Within normal limits.  Additional comments: Hyperechoic hepatic parenchyma, suggesting hepatic steatosis.  IMPRESSION: 13 mm right upper pole renal calculus. No hydronephrosis.   Electronically Signed  By: Charline Bills M.D.  On: 03/20/2015 13:40 Assessment & Plan:    1. Right ureteral stone:    KUB did not demonstrate any ureteral stone and RUS did not demonstrate any hydronephrosis.  Patient did not pass a specific fragment and he believes its the medication that was given to him in [place] that the dissolved stone.  2.Microscopic hematuria:   Patient continues to have microscopic hematuria.  He did complete a CT Urogram in 12/2014.  He needs  a cystoscopy to complete his hematuria workup.    - Urinalysis, Complete  3. Nephrolithiasis:   Patient with bilateral stones.  He would like these managed by his urologist in [place].  Return for  cystoscopy.  Michiel Cowboy, PA-C  [facility] [address] [place], [place] [address] [phone]

## 2015-03-29 LAB — CULTURE, URINE COMPREHENSIVE

## 2015-04-01 DIAGNOSIS — G4733 Obstructive sleep apnea (adult) (pediatric): Secondary | ICD-10-CM | POA: Insufficient documentation

## 2015-04-06 ENCOUNTER — Ambulatory Visit (INDEPENDENT_AMBULATORY_CARE_PROVIDER_SITE_OTHER): Payer: BLUE CROSS/BLUE SHIELD | Admitting: Urology

## 2015-04-06 VITALS — BP 144/84 | HR 96 | Ht 72.0 in | Wt 325.0 lb

## 2015-04-06 DIAGNOSIS — R3129 Other microscopic hematuria: Secondary | ICD-10-CM | POA: Diagnosis not present

## 2015-04-06 DIAGNOSIS — N200 Calculus of kidney: Secondary | ICD-10-CM | POA: Diagnosis not present

## 2015-04-06 MED ORDER — LIDOCAINE HCL 2 % EX GEL
1.0000 "application " | Freq: Once | CUTANEOUS | Status: AC
  Administered 2015-04-06: 1 via URETHRAL

## 2015-04-06 MED ORDER — CIPROFLOXACIN HCL 500 MG PO TABS
500.0000 mg | ORAL_TABLET | Freq: Once | ORAL | Status: AC
  Administered 2015-04-06: 500 mg via ORAL

## 2015-04-06 NOTE — Progress Notes (Signed)
04/06/2015 2:57 PM   Marney Setting Aug 26, 1965 [id number]  Referring provider: Wanda Plump, MD [address] [place], [place] [address]  Chief Complaint  Patient presents with  . Cysto    HPI: Patient is a 50 year old Caucasian male with a history of microscopic hematuria and nephrolithiasis who presents today for cystoscopy for microscopic hematuria. His urologist in [place] asthma allopurinol and 2 other drugs that he is unsure of the name of. He was told he had presumed uric acid stones. He continues to be interested in not pursuing some treatment [place]. He presents today for cystoscopy since his microscopic hematuria. He understands that we typically treat the stones and look for resolution of the microscopic hematuria, but since he is not interested in treating the stones at this time that we should rule out any bladder pathology is cystoscopy. His CT urogram was negative for malignancy.  Previous history Patient was seen in the emergency room on 11/25/2014 for a 9 mm proximal right ureteral stone with obstructive changes. Patient has a history of nephrolithiasis and takes medications prescribed to him by a doctor in [place] for his stones. I'm not familiar with these medications nor do I know his stone composition's. He states he's never had to undergo surgery for stones and is passed them spontaneously. He has not had a metabolic workup. CT Urogram on 12/23/2014 demonstrates a migration of the right ureteral calculus from the UPJ to the mid ureter. He has minimal hydroureter and pelvic caliectasis on the right. He has an additional 9 mm right ureteral calculus. Punctate left renal calculus. Patient then left for [place].  Today, the patient states he has not had any symptoms of renal colic or hematuria. He has not passed a stone. His UA today demonstrates 11-30RBC's/hpf. He has not had any fevers, chills, nausea or vomiting.   KUB completed on  03/19/2105 noted a large stool burden. RUS completed on 03/20/2015 noted a 13 mm right upper pole renal calculus. I have reviewed the films.    PMH: Past Medical History  Diagnosis Date  . Asthma     ? of asthma   . Headache(784.0)     intense on-off, ibuprofen helps   . Seasonal allergies   . Hearing loss aprox. 2007    low tone decreased , R side, w/u neg per ENT  . Diabetes mellitus   . Urolithiasis     while in [place] 09-2013  . Diabetes mellitus (HCC)   . Type 2 diabetes mellitus (HCC) 12/01/2014  . General medical examination 12/25/2010  . Advice or immunization for travel 05/21/2011  . Gout 12/01/2014  . Adiposity 12/01/2014  . Microscopic hematuria     Surgical History: Past Surgical History  Procedure Laterality Date  . Knee surgery  93    carthilage  repair post injury , R  . Elbow surgery Left 2012    cubital tunnel syndrome   . Wisdom tooth extraction      Home Medications:    Medication List       This list is accurate as of: 04/06/15  2:57 PM.  Always use your most recent med list.               allopurinol 300 MG tablet  Commonly known as:  ZYLOPRIM  Take 1 tablet (300 mg total) by mouth daily.     aspirin EC 325 MG tablet  Take 325 mg by mouth daily. Reported  on 03/27/2015     atorvastatin 10 MG tablet  Commonly known as:  LIPITOR  Take by mouth.     CELEBREX 200 MG capsule  Generic drug:  celecoxib  Take by mouth. Reported on 03/27/2015     etodolac 500 MG tablet  Commonly known as:  LODINE  Take 1 tablet (500 mg total) by mouth 2 (two) times daily as needed.     glipiZIDE 5 MG 24 hr tablet  Commonly known as:  GLUCOTROL XL  Take by mouth.     GLUCOSAMINE CHONDR 1500 COMPLX PO  Take by mouth. Reported on 03/27/2015     HYDROcodone-acetaminophen 5-325 MG tablet  Commonly known as:  NORCO/VICODIN  Take 1 tablet by mouth every 6 (six) hours as needed for moderate pain.     metFORMIN 1000 MG tablet  Commonly known as:  GLUCOPHAGE   Take 1 tablet (1,000 mg total) by mouth 2 (two) times daily with a meal.     multivitamin with minerals Tabs tablet  Take 1 tablet by mouth daily.     ondansetron 4 MG disintegrating tablet  Commonly known as:  ZOFRAN ODT  Take 1 tablet (4 mg total) by mouth every 6 (six) hours as needed for nausea or vomiting.     tamsulosin 0.4 MG Caps capsule  Commonly known as:  FLOMAX  Take 1 capsule (0.4 mg total) by mouth daily.     UNABLE TO FIND  Med Name: Cystone / [place] Reek TO FIND  Med Name: Renalof (Catalysis)     UNABLE TO FIND  Med Name: Rowatinex (Kapsein)        Allergies:  Allergies  Allergen Reactions  . Codeine Nausea And Vomiting    Family History: Family History  Problem Relation Age of Onset  . Rheum arthritis Father   . Breast cancer Mother   . Prostate cancer Father 73  . Stroke Mother   . AAA (abdominal aortic aneurysm) Father     AAA father dx age 87 aproxand GF  . Colon cancer Neg Hx   . Colon polyps Father     dx in his 77s  . Coronary artery disease Neg Hx   . Diabetes      GF?  Marland Kitchen Dementia Mother   . AAA (abdominal aortic aneurysm) Sister     Social History:  reports that he has never smoked. He has never used smokeless tobacco. He reports that he drinks alcohol. He reports that he does not use illicit drugs.  ROS:                                        Physical Exam: BP 144/84 mmHg  Pulse 96  Ht 6' (1.829 m)  Wt 325 lb (147.419 kg)  BMI 44.07 kg/m2  Constitutional:  Alert and oriented, No acute distress. HEENT: [place] AT, moist mucus membranes.  Trachea midline, no masses. Cardiovascular: No clubbing, cyanosis, or edema. Respiratory: Normal respiratory effort, no increased work of breathing. GI: Abdomen is soft, nontender, nondistended, no abdominal masses GU: No CVA tenderness.  Skin: No rashes, bruises or suspicious lesions. Lymph: No cervical or inguinal adenopathy. Neurologic: Grossly intact, no  focal deficits, moving all 4 extremities. Psychiatric: Normal mood and affect.  Laboratory Data: Lab Results  Component Value Date   WBC 14.8* 11/25/2014   HGB 14.7 11/25/2014   HCT 43.2  11/25/2014   MCV 92.7 11/25/2014   PLT 234 11/25/2014    Lab Results  Component Value Date   CREATININE 1.35* 11/25/2014    Lab Results  Component Value Date   PSA 0.63 09/22/2014    No results found for: TESTOSTERONE  Lab Results  Component Value Date   HGBA1C 7.7* 09/22/2014    Urinalysis    Component Value Date/Time   COLORURINE YELLOW* 11/30/2014 1548   APPEARANCEUR CLEAR* 11/30/2014 1548   LABSPEC 1.026 11/30/2014 1548   PHURINE 5.0 11/30/2014 1548   GLUCOSEU Negative 03/27/2015 0938   HGBUR NEGATIVE 11/30/2014 1548   BILIRUBINUR Negative 03/27/2015 0938   BILIRUBINUR 2+* 11/30/2014 1548   KETONESUR NEGATIVE 11/30/2014 1548   PROTEINUR NEGATIVE 11/30/2014 1548   NITRITE Negative 03/27/2015 0938   NITRITE NEGATIVE 11/30/2014 1548   LEUKOCYTESUR Negative 03/27/2015 0938   LEUKOCYTESUR NEGATIVE 11/30/2014 1548     Cystoscopy Procedure Note  Patient identification was confirmed, informed consent was obtained, and patient was prepped using Betadine solution.  Lidocaine jelly was administered per urethral meatus.    Preoperative abx where received prior to procedure.     Pre-Procedure: - Inspection reveals a normal caliber ureteral meatus.  Procedure: The flexible cystoscope was introduced without difficulty - No urethral strictures/lesions are present. - Normal prostate  - Normal bladder neck - Bilateral ureteral orifices identified - Bladder mucosa  reveals no ulcers, tumors, or lesions - No bladder stones - No trabeculation  Retroflexion shows no intravesical lobe   Post-Procedure: - Patient tolerated the procedure well    Assessment & Plan:    1. Microscopic hematuria Negative work up except for nephrolithiasis.  2. Nephrolithiasis: The patient has  bilateral stones though only the significant stone burden is on the right with a nonobstructing 13 mm stone. He also has a questionable mid ureteral stone that was seen on a CT urogram in November 2016. He is not visible on KUB and does not have hydronephrosis at this time. But he has not passed a stone to his knowledge though. I offered him surgical intervention for these stones, however he would like these managed by his urologist in the [place]. I offered him follow-up with [place] if he changes his mind or if he develops an acute issue while in town.    Return if symptoms worsen or fail to improve.  Hildred Laser, MD  [facility] [address] [place], [place] [address] [phone]

## 2015-04-07 LAB — URINALYSIS, COMPLETE
Bilirubin, UA: NEGATIVE
GLUCOSE, UA: NEGATIVE
Ketones, UA: NEGATIVE
Leukocytes, UA: NEGATIVE
NITRITE UA: NEGATIVE
PROTEIN UA: NEGATIVE
Specific Gravity, UA: 1.02 (ref 1.005–1.030)
Urobilinogen, Ur: 0.2 mg/dL (ref 0.2–1.0)
pH, UA: 6 (ref 5.0–7.5)

## 2015-04-07 LAB — MICROSCOPIC EXAMINATION
Bacteria, UA: NONE SEEN
RBC, UA: >30 /hpf — AB (ref 0–?)

## 2015-04-13 ENCOUNTER — Encounter: Payer: Self-pay | Admitting: Urology

## 2015-04-14 ENCOUNTER — Telehealth: Payer: Self-pay

## 2015-04-14 NOTE — Telephone Encounter (Signed)
Ms. Marvel PlanMcGowan,        Do I still need to continue with the FlowMax?        Domingo Pulseavid Rachel    [place].

## 2015-04-16 NOTE — Telephone Encounter (Signed)
Yes.  It would be beneficial to continue the Flomax.

## 2015-04-20 NOTE — Telephone Encounter (Signed)
Carollee HerterShannon please advise patient is inquiring through [place] again.

## 2015-04-24 NOTE — Telephone Encounter (Signed)
Patient notified he states he was only taking the Flomax to help pass the stone, so he does not see why he should continue?

## 2015-04-24 NOTE — Telephone Encounter (Signed)
Patient notified/SW 

## 2015-04-24 NOTE — Telephone Encounter (Signed)
He has a remaining stone in his left kidney and a fragment may split from that stone.  If he does not want to take the Flomax, he doesn't need to continue.

## 2017-09-13 ENCOUNTER — Encounter: Payer: Self-pay | Admitting: Emergency Medicine

## 2017-09-13 ENCOUNTER — Emergency Department: Payer: Managed Care, Other (non HMO)

## 2017-09-13 ENCOUNTER — Emergency Department
Admission: EM | Admit: 2017-09-13 | Discharge: 2017-09-13 | Disposition: A | Discharge status: Home / Self Care | Payer: Managed Care, Other (non HMO) | Attending: Emergency Medicine | Admitting: Emergency Medicine

## 2017-09-13 DIAGNOSIS — Z7984 Long term (current) use of oral hypoglycemic drugs: Secondary | ICD-10-CM | POA: Insufficient documentation

## 2017-09-13 DIAGNOSIS — R42 Dizziness and giddiness: Secondary | ICD-10-CM | POA: Diagnosis not present

## 2017-09-13 DIAGNOSIS — J45909 Unspecified asthma, uncomplicated: Secondary | ICD-10-CM | POA: Insufficient documentation

## 2017-09-13 DIAGNOSIS — E119 Type 2 diabetes mellitus without complications: Secondary | ICD-10-CM | POA: Diagnosis not present

## 2017-09-13 DIAGNOSIS — Z7982 Long term (current) use of aspirin: Secondary | ICD-10-CM | POA: Diagnosis not present

## 2017-09-13 LAB — BASIC METABOLIC PANEL
Anion gap: 10 (ref 5–15)
BUN: 17 mg/dL (ref 6–20)
CO2: 25 mmol/L (ref 22–32)
CREATININE: 0.82 mg/dL (ref 0.61–1.24)
Calcium: 9.1 mg/dL (ref 8.9–10.3)
Chloride: 103 mmol/L (ref 98–111)
GFR calc Af Amer: >60 mL/min (ref >60)
GFR calc non Af Amer: >60 mL/min (ref >60)
GLUCOSE: 160 mg/dL — AB (ref 70–99)
POTASSIUM: 3.8 mmol/L (ref 3.5–5.1)
Sodium: 138 mmol/L (ref 135–145)

## 2017-09-13 LAB — CBC
HEMATOCRIT: 46.3 % (ref 40.0–52.0)
Hemoglobin: 15.9 g/dL (ref 13.0–18.0)
MCH: 32.6 pg (ref 26.0–34.0)
MCHC: 34.2 g/dL (ref 32.0–36.0)
MCV: 95.3 fL (ref 80.0–100.0)
PLATELETS: 331 10*3/uL (ref 150–440)
RBC: 4.86 MIL/uL (ref 4.40–5.90)
RDW: 13.5 % (ref 11.5–14.5)
WBC: 12.5 10*3/uL — ABNORMAL HIGH (ref 3.8–10.6)

## 2017-09-13 LAB — GLUCOSE, CAPILLARY: Glucose-Capillary: 139 mg/dL — ABNORMAL HIGH (ref 70–99)

## 2017-09-13 LAB — TROPONIN I: Troponin I: <0.03 ng/mL (ref <0.03)

## 2017-09-13 MED ORDER — SODIUM CHLORIDE 0.9 % IV BOLUS
1000.0000 mL | Freq: Once | INTRAVENOUS | Status: AC
  Administered 2017-09-13: 1000 mL via INTRAVENOUS

## 2017-09-13 MED ORDER — DIAZEPAM 2 MG PO TABS
2.0000 mg | ORAL_TABLET | Freq: Once | ORAL | Status: AC
  Administered 2017-09-13: 2 mg via ORAL
  Filled 2017-09-13: qty 1

## 2017-09-13 MED ORDER — DIAZEPAM 2 MG PO TABS: 2.0000 mg | ORAL_TABLET | Freq: Three times a day (TID) | ORAL | 0 refills | Status: AC | PRN

## 2017-09-13 MED ORDER — MECLIZINE HCL 25 MG PO TABS
25.0000 mg | ORAL_TABLET | Freq: Once | ORAL | Status: AC
  Administered 2017-09-13: 25 mg via ORAL
  Filled 2017-09-13: qty 1

## 2017-09-13 NOTE — ED Notes (Signed)
Pt states hx of vertigo. Has had tx from his pcp to help. [facility] and was worse so was sent over.

## 2017-09-13 NOTE — ED Notes (Signed)
Pt to mri. Ambulated to w/c and states he feels better after the medication.

## 2017-09-13 NOTE — ED Triage Notes (Signed)
Patient states last night he was looking down trying to plug his tablet into the charger and then he suddenly felt very dizzy.  Patient states he took 2 meclizine tablets this morning that he had from past history of vertigo and they only, "took the edge off" so he came here.  [facility].  Patient reports nausea and vomiting x 1.

## 2017-09-13 NOTE — ED Provider Notes (Signed)
[facility] Emergency Department Provider Note  ____________________________________________  Time seen: Approximately 5:55 PM  I have reviewed the triage vital signs and the nursing notes.   HISTORY  Chief Complaint Dizziness   HPI Nathaniel Luna is a 52 y.o. male with a history of diabetes who presents for evaluation of dizziness.  Patient reports that he was standing up looking down to plug his phone when he developed sudden onset of severe dizziness.  He describes the dizziness as room spinning, severe, associated with nausea and one episode of vomiting.  He took 2 meclizines and went to urgent care.  he was then sent to the emergency room for evaluation.  Patient reports a history of vertigo 3 times in the past. He reports that it feels similar.  No tinnitus, dysarthria, diplopia, dysphasia, facial droop, slurred speech, unilateral weakness or numbness.  No personal history of stroke.  Patient's mother has had a stroke in the past.  Is not a smoker.  Patient at this time reports that the room spinning sensation has resolved however he still feeling "off balance like when you are seasick". No HA or head trauma.    Past Medical History:  Diagnosis Date  . Adiposity 12/01/2014  . Advice or immunization for travel 05/21/2011  . Asthma    ? of asthma   . Diabetes mellitus   . Diabetes mellitus (HCC)   . General medical examination 12/25/2010  . Gout 12/01/2014  . Headache(784.0)    intense on-off, ibuprofen helps   . Hearing loss aprox. 2007   low tone decreased , R side, w/u neg per ENT  . Microscopic hematuria   . Seasonal allergies   . Type 2 diabetes mellitus (HCC) 12/01/2014  . Urolithiasis    while in [place] 09-2013    Patient Active Problem List   Diagnosis Date Noted  . Obstructive apnea 04/01/2015  . Abnormal WBC count 03/15/2015  . Abnormality of plasma protein 03/15/2015  . Pure hypercholesterolemia 03/15/2015  . H/O renal calculi  03/14/2015  . Microscopic hematuria 12/26/2014  . Nephrolithiasis 12/02/2014  . Right ureteral stone 12/02/2014  . Type 2 diabetes mellitus (HCC) 12/01/2014  . Gout 12/01/2014  . Adiposity 12/01/2014  . Urolithiasis -- was rx allopurinol 09/16/2014  . Elevated uric acid in blood 09/16/2014  . Advice or immunization for travel 05/21/2011  . General medical examination 12/25/2010  . Diabetes mellitus (HCC)     Past Surgical History:  Procedure Laterality Date  . ELBOW SURGERY Left 2012   cubital tunnel syndrome   . KNEE SURGERY  93   carthilage  repair post injury , R  . WISDOM TOOTH EXTRACTION      Prior to Admission medications   Medication Sig Start Date End Date Taking? Authorizing Provider  allopurinol (ZYLOPRIM) 300 MG tablet Take 1 tablet (300 mg total) by mouth daily. 11/28/14   Wanda Plump, MD  aspirin EC 325 MG tablet Take 325 mg by mouth daily. Reported on 03/27/2015    [provider]  atorvastatin (LIPITOR) 10 MG tablet Take by mouth. 03/15/15 03/14/16  [provider]  diazepam (VALIUM) 2 MG tablet Take 1 tablet (2 mg total) by mouth every 8 (eight) hours as needed (vertigo). 09/13/17 09/13/18  Nita Sickle, MD  etodolac (LODINE) 500 MG tablet Take 1 tablet (500 mg total) by mouth 2 (two) times daily as needed. 12/26/14   Michiel Cowboy A, PA-C  glipiZIDE (GLUCOTROL XL) 5 MG 24 hr  tablet Take by mouth. 03/15/15 03/14/16  [provider]  Glucosamine-Chondroit-Vit C-Mn (GLUCOSAMINE CHONDR 1500 COMPLX PO) Take by mouth. Reported on 03/27/2015    [provider]  HYDROcodone-acetaminophen (NORCO/VICODIN) 5-325 MG tablet Take 1 tablet by mouth every 6 (six) hours as needed for moderate pain. 12/26/14   Michiel CowboyMcGowan, Shannon A, PA-C  metFORMIN (GLUCOPHAGE) 1000 MG tablet Take 1 tablet (1,000 mg total) by mouth 2 (two) times daily with a meal. 09/26/14   Wanda PlumpPaz, Jose E, MD  Multiple Vitamin (MULTIVITAMIN WITH MINERALS) TABS tablet Take 1 tablet by mouth  daily.    [provider]  ondansetron (ZOFRAN ODT) 4 MG disintegrating tablet Take 1 tablet (4 mg total) by mouth every 6 (six) hours as needed for nausea or vomiting. 11/25/14   Sharyn CreamerQuale, Mark, MD  tamsulosin (FLOMAX) 0.4 MG CAPS capsule Take 1 capsule (0.4 mg total) by mouth daily. 03/10/15   McGowan, Wellington HampshireShannon A, PA-C  UNABLE TO FIND Med Name: Cystone / Uricare    [provider]  UNABLE TO FIND Med Name: Renalof (Catalysis)    [provider]  UNABLE TO FIND Med Name: Rowatinex Ignacia Marvel(Kapsein)    [provider]    Allergies Codeine  Family History  Problem Relation Age of Onset  . Rheum arthritis Father   . Prostate cancer Father [id number]  . AAA (abdominal aortic aneurysm) Father        AAA father dx age 53 aproxand GF  . Colon polyps Father        dx in his 450s  . Breast cancer Mother   . Stroke Mother   . Dementia Mother   . Diabetes Unknown        GF?  . AAA (abdominal aortic aneurysm) Sister   . Colon cancer Neg Hx   . Coronary artery disease Neg Hx     Social History Social History   Tobacco Use  . Smoking status: Never Smoker  . Smokeless tobacco: Never Used  Substance Use Topics  . Alcohol use: Yes    Comment: socially   . Drug use: No    Review of Systems  Constitutional: Negative for fever. Eyes: Negative for visual changes. ENT: Negative for sore throat. Neck: No neck pain  Cardiovascular: Negative for chest pain. Respiratory: Negative for shortness of breath. Gastrointestinal: Negative for abdominal pain or diarrhea. + N/V Genitourinary: Negative for dysuria. Musculoskeletal: Negative for back pain. Skin: Negative for rash. Neurological: Negative for headaches, weakness or numbness. + vertigo Psych: No SI or HI  ____________________________________________   PHYSICAL EXAM:  VITAL SIGNS: ED Triage Vitals  Enc Vitals Group     BP 09/13/17 1410 (!) 138/92     Pulse Rate 09/13/17 1410 78     Resp 09/13/17 1410 16      Temp 09/13/17 1410 98.4 F (36.9 C)     Temp Source 09/13/17 1410 Oral     SpO2 09/13/17 1410 94 %     Weight 09/13/17 1411 293 lb (132.9 kg)     Height 09/13/17 1411 5\' 10"  (1.778 m)     Head Circumference --      Peak Flow --      Pain Score 09/13/17 1418 0     Pain Loc --      Pain Edu? --      Excl. in GC? --     Constitutional: Alert and oriented. Well appearing and in no apparent distress. HEENT:      Head: Normocephalic  and atraumatic.         Eyes: Conjunctivae are normal. Sclera is non-icteric.       Mouth/Throat: Mucous membranes are moist.       Neck: Supple with no signs of meningismus. Cardiovascular: Regular rate and rhythm. No murmurs, gallops, or rubs. 2+ symmetrical distal pulses are present in all extremities. No JVD. Respiratory: Normal respiratory effort. Lungs are clear to auscultation bilaterally. No wheezes, crackles, or rhonchi.  Gastrointestinal: Soft, non tender, and non distended with positive bowel sounds. No rebound or guarding. Musculoskeletal: Nontender with normal range of motion in all extremities. No edema, cyanosis, or erythema of extremities. Neurologic: Normal speech and language. A & O x3, PERRL, EOMI, no nystagmus, CN II-XII intact, motor testing reveals good tone and bulk throughout. There is no evidence of pronator drift or dysmetria. Muscle strength is 5/5 throughout. Deep tendon reflexes are 2+ throughout with downgoing toes. Sensory examination is intact. Gait is slightly ataxic. Skin: Skin is warm, dry and intact. No rash noted. Psychiatric: Mood and affect are normal. Speech and behavior are normal.  ____________________________________________   LABS (all labs ordered are listed, but only abnormal results are displayed)  Labs Reviewed  BASIC METABOLIC PANEL - Abnormal; Notable for the following components:      Result Value   Glucose, Bld 160 (*)    All other components within normal limits  CBC - Abnormal; Notable for the following  components:   WBC 12.5 (*)    All other components within normal limits  GLUCOSE, CAPILLARY - Abnormal; Notable for the following components:   Glucose-Capillary 139 (*)    All other components within normal limits  TROPONIN I  URINALYSIS, COMPLETE (UACMP) WITH MICROSCOPIC   ____________________________________________  EKG  ED ECG REPORT I, Nita Sickle, the attending physician, personally viewed and interpreted this ECG.  Normal sinus rhythm, rate of 77, normal intervals, normal axis, no ST elevations or depressions, low QRS.  Unchanged from prior from 2012. ____________________________________________  RADIOLOGY  I have personally reviewed the images performed during this visit and I agree with the Radiologist's read.   Interpretation by Radiologist:  Ct Head Wo Contrast  Result Date: 09/13/2017 CLINICAL DATA:  Dizziness. EXAM: CT HEAD WITHOUT CONTRAST TECHNIQUE: Contiguous axial images were obtained from the base of the skull through the vertex without intravenous contrast. COMPARISON:  None. FINDINGS: Brain: No subdural, epidural, or subarachnoid hemorrhage. Ventricles and sulci are normal. Cerebellum, brainstem, and basal cisterns are normal. No mass effect or midline shift. No acute cortical ischemia or midline shift. No acute cortical ischemia or infarct identified. Vascular: No hyperdense vessel or unexpected calcification. Skull: Normal. Negative for fracture or focal lesion. Sinuses/Orbits: No acute finding. Other: None. IMPRESSION: No acute intracranial abnormalities. Electronically Signed   By: Gerome Sam III M.D   On: 09/13/2017 17:57   Mr Brain Wo Contrast  Result Date: 09/13/2017 CLINICAL DATA:  Vertigo, persistent, central. Patient had acute onset of vertigo after looking down. This was only partially relieved with meclizine. EXAM: MRI HEAD WITHOUT CONTRAST TECHNIQUE: Multiplanar, multiecho pulse sequences of the brain and surrounding structures were obtained  without intravenous contrast. COMPARISON:  CT head without contrast 09/13/2017 FINDINGS: Brain: No acute infarct, hemorrhage, or mass lesion is present. The ventricles are of normal size. No significant white matter disease is present. The internal auditory canals are within normal limits bilaterally. Fluid is present in the vestibule and semicircular canals bilaterally. The brainstem and cerebellum are normal. No significant extra-axial fluid  collection is present. Vascular: Flow is present in the major intracranial arteries. Skull and upper cervical spine: Skull base is within normal limits. The craniocervical junction is normal. The upper cervical spine is within normal limits. Sinuses/Orbits: The paranasal sinuses and left mastoid air cells are clear. Right mastoid effusion is noted. No obstructing nasopharyngeal lesion is present. IMPRESSION: 1. Normal MRI appearance the brain, no acute or focal lesion to explain acute and persistent vertigo. 2. Minimal right mastoid effusion. No obstructing nasopharyngeal lesion is present. This is unlikely be of consequence to the patient. Electronically Signed   By: Marin Roberts M.D.   On: 09/13/2017 20:42     ____________________________________________   PROCEDURES  Procedure(s) performed: None Procedures Critical Care performed:  None ____________________________________________   INITIAL IMPRESSION / ASSESSMENT AND PLAN / ED COURSE  52 y.o. male with a history of diabetes who presents for evaluation of dizziness.  Patient initially describing vertigo however now is describing C6/off-balance sensation.  His gait is slightly ataxic but other than that is completely neurologically intact.  Vitals are within normal limits.  Head CT shows no acute findings.  We will send patient for an MRI to rule out posterior fossa or stroke.  Will treat with meclizine, Valium and fluids for possible peripheral vertigo.  Clinical Course as of Sep 13 2049  Sat Sep 13, 2017  2049 Work-up with no acute findings, negative MRI.  Patient symptoms are improved.  Patient is ambulatory at this time with normal steady gait.  Recommend to follow-up with ENT for further evaluation.  Will provide patient with a small prescription for Valium for recurrence of symptoms.  Discussed not driving or mixing with alcohol due to sedation while taking this medication.  Discussed return precautions for any signs of acute stroke.   [CV]    Clinical Course User Index [CV] Don Perking Washington, MD     As part of my medical decision making, I reviewed the following data within the electronic MEDICAL RECORD NUMBER Nursing notes reviewed and incorporated, Labs reviewed , EKG interpreted , Old chart reviewed, Radiograph reviewed , Notes from prior ED visits and [place] Controlled Substance Database    Pertinent labs & imaging results that were available during my care of the patient were reviewed by me and considered in my medical decision making (see chart for details).    ____________________________________________   FINAL CLINICAL IMPRESSION(S) / ED DIAGNOSES  Final diagnoses:  Vertigo      NEW MEDICATIONS STARTED DURING THIS VISIT:  ED Discharge Orders         Ordered    diazepam (VALIUM) 2 MG tablet  Every 8 hours PRN     09/13/17 2050           Note:  This document was prepared using Dragon voice recognition software and may include unintentional dictation errors.    Nita Sickle, MD 09/13/17 2051

## 2020-08-03 IMAGING — MR MR HEAD W/O CM
10 series · 48 of 48 positions shown · non-contrast
Comparison: CT head without contrast 09/13/2017

CLINICAL DATA: Vertigo, persistent, central. Patient had acute
onset of vertigo after looking down. This was only partially
relieved with meclizine.

EXAM:
MRI HEAD WITHOUT CONTRAST
TECHNIQUE: Multiplanar, multiecho pulse sequences of the brain and surrounding
structures were obtained without intravenous contrast.

[Series 2: T1 · sagittal · 5.0mm · 0.45mm/px · 3 of 25 slices shown (1 of 2)]
[im 1/25]
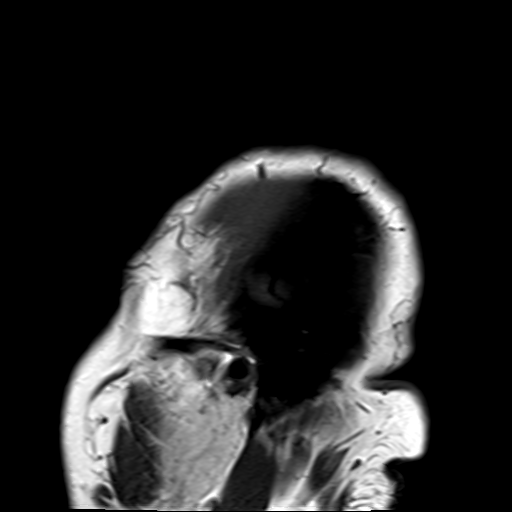
[im 13/25]
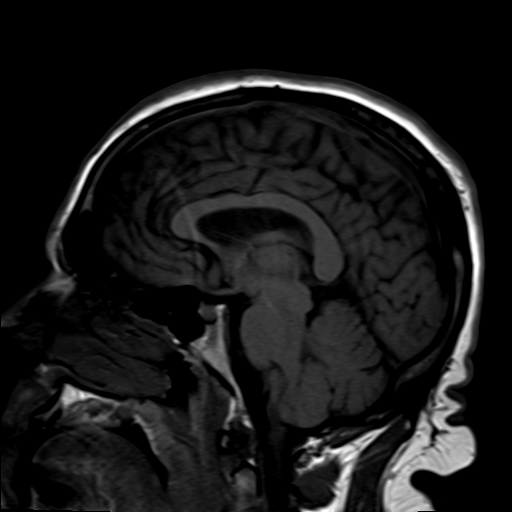
[im 25/25]
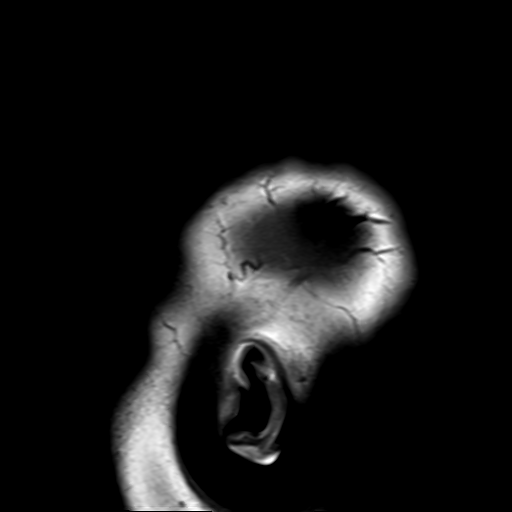

[Series 4: DWI · axial · 3.0mm · 1.80mm/px · z∈[-56,+106]mm · 5 of 55 slices shown (1 of 4)]
[im 1/55]
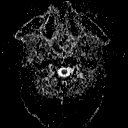
[im 14/55]
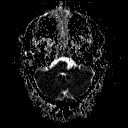
[im 28/55]
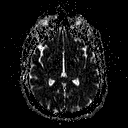
[im 41/55]
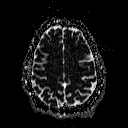
[im 55/55]
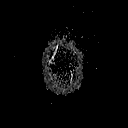

[Series 6: DWI · coronal · 3.0mm · 1.80mm/px · 4 of 49 slices shown (2 of 4)]
[im 1/49]
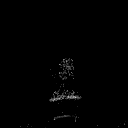
[im 17/49]
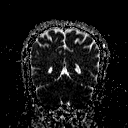
[im 33/49]
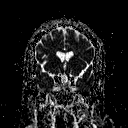
[im 49/49]
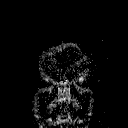

[Series 7: T2 · axial · 5.0mm · 0.72mm/px · z∈[-36,+116]mm · 2 of 25 slices shown (1 of 3)]
[im 1/25]
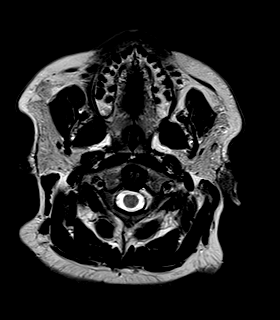
[im 25/25]
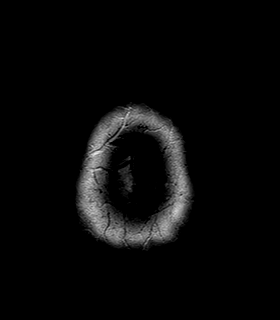

[Series 8: FLAIR · axial · 3.0mm · 0.45mm/px · z∈[-36,+116]mm · 5 of 53 slices shown]
[im 1/53]
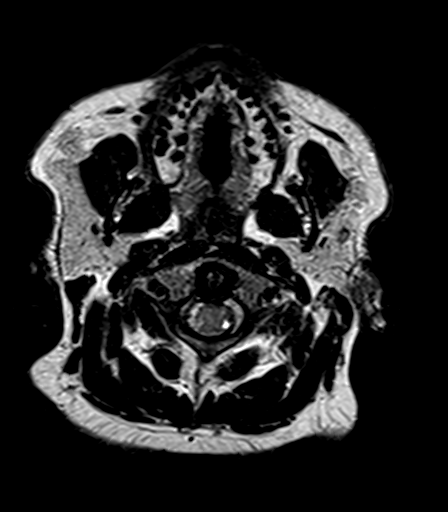
[im 14/53]
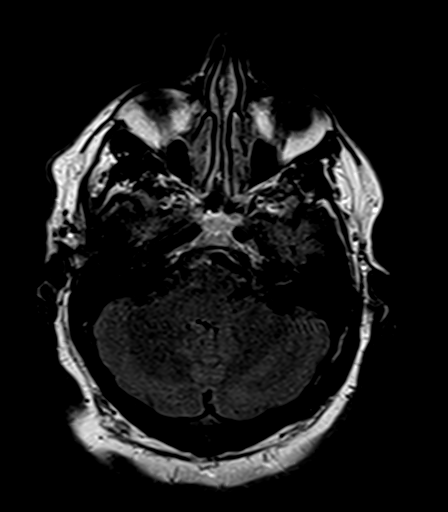
[im 27/53]
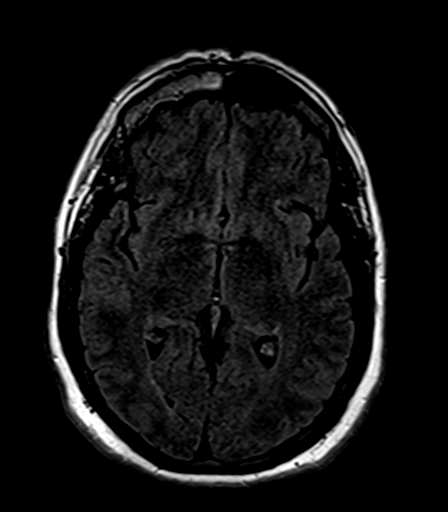
[im 40/53]
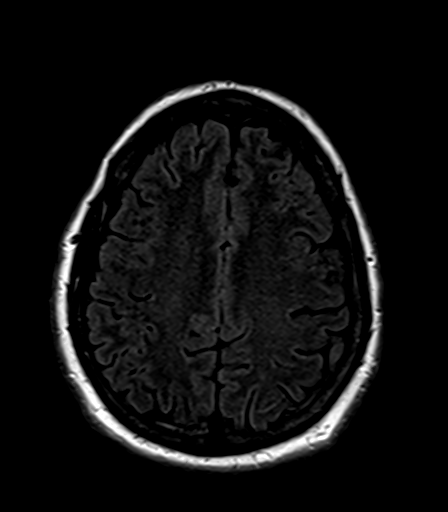
[im 53/53]
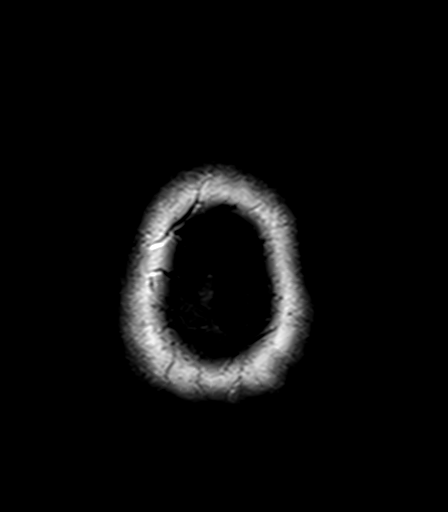

[Series 9: T2 · axial · 5.0mm · 0.45mm/px · z∈[-36,+116]mm · 2 of 25 slices shown (2 of 3)]
[im 1/25]
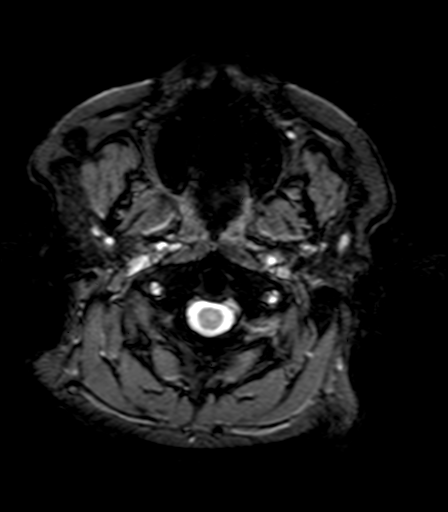
[im 25/25]
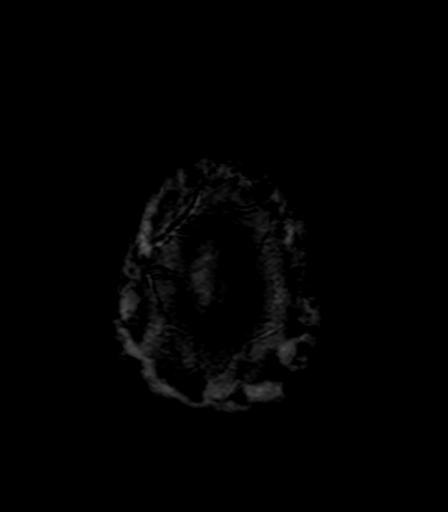

[Series 10: T1 · axial · 1.0mm · 1.00mm/px · z∈[-43,+128]mm · 16 of 176 slices shown (2 of 2)]
[im 1/176]
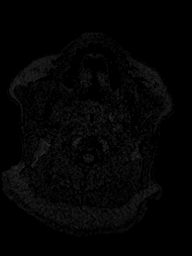
[im 12/176]
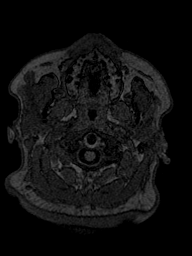
[im 24/176]
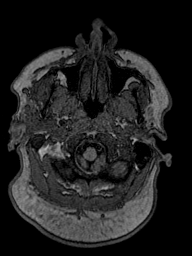
[im 36/176]
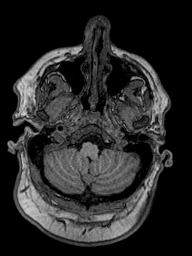
[im 47/176]
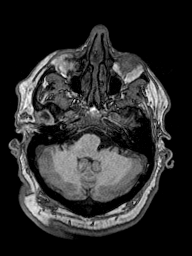
[im 59/176]
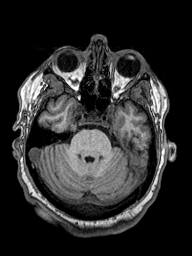
[im 71/176]
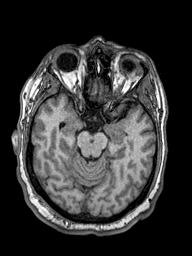
[im 82/176]
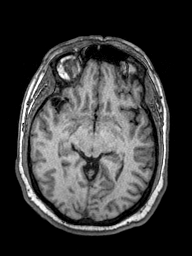
[im 94/176]
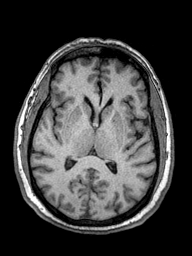
[im 106/176]
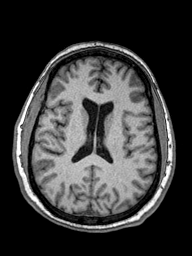
[im 117/176]
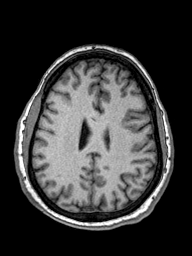
[im 129/176]
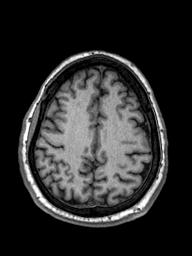
[im 141/176]
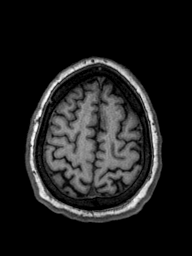
[im 152/176]
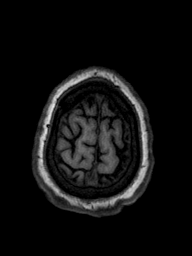
[im 164/176]
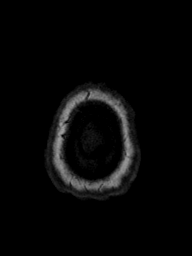
[im 176/176]
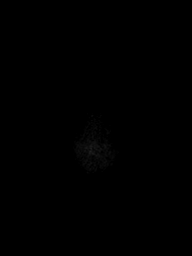

[Series 11: T2 · coronal · 5.0mm · 0.69mm/px · 2 of 27 slices shown (3 of 3)]
[im 1/27]
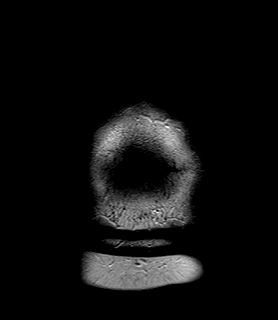
[im 27/27]
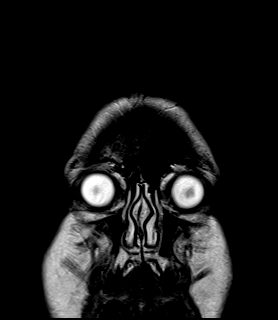

[Series 100: DWI · axial · 3.0mm · 1.80mm/px · z∈[-56,+106]mm · 5 of 55 slices shown (3 of 4)]
[im 1/55]
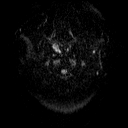
[im 14/55]
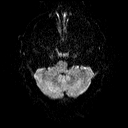
[im 28/55]
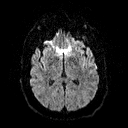
[im 41/55]
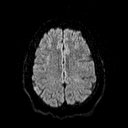
[im 55/55]
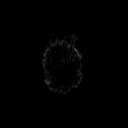

[Series 101: DWI · coronal · 3.0mm · 1.80mm/px · 4 of 49 slices shown (4 of 4)]
[im 1/49]
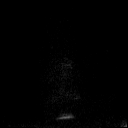
[im 17/49]
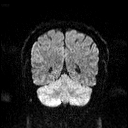
[im 33/49]
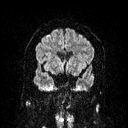
[im 49/49]
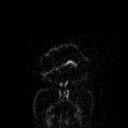

[48 of 48 positions shown; findings below may reference images not displayed]

FINDINGS: Brain: No acute infarct, hemorrhage, or mass lesion is present. The
ventricles are of normal size. No significant white matter disease
is present. The internal auditory canals are within normal limits
bilaterally. Fluid is present in the vestibule and semicircular
canals bilaterally. The brainstem and cerebellum are normal. No
significant extra-axial fluid collection is present.

Vascular: Flow is present in the major intracranial arteries.

Skull and upper cervical spine: Skull base is within normal limits.
The craniocervical junction is normal. The upper cervical spine is
within normal limits.

Sinuses/Orbits: The paranasal sinuses and left mastoid air cells are
clear. Right mastoid effusion is noted. No obstructing
nasopharyngeal lesion is present.
IMPRESSION: 1. Normal MRI appearance the brain, no acute or focal lesion to
explain acute and persistent vertigo.
2. Minimal right mastoid effusion. No obstructing nasopharyngeal
lesion is present. This is unlikely be of consequence to the
patient.

## 2020-08-03 IMAGING — CT CT HEAD W/O CM
3 series · 16 of 47 positions shown, 19 images · non-contrast
Comparison: None.

CLINICAL DATA: Dizziness.

EXAM:
CT HEAD WITHOUT CONTRAST
TECHNIQUE: Contiguous axial images were obtained from the base of the skull
through the vertex without intravenous contrast.

[Series 2: head wo · axial · 0.47mm/px · z∈[-119,+6]mm · 10 of 31 slices shown, 13 images]
[im 3/31  brain]
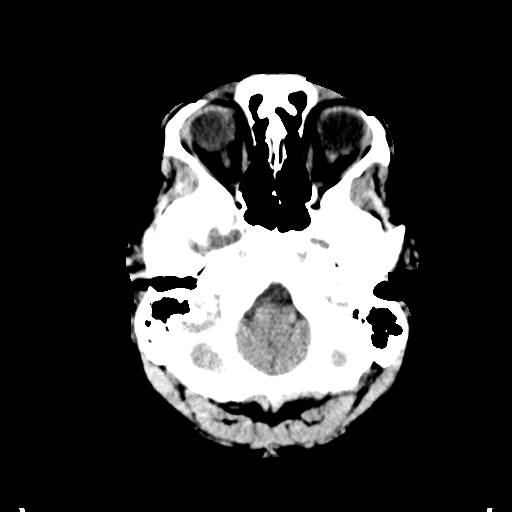
[im 3/31  bone]
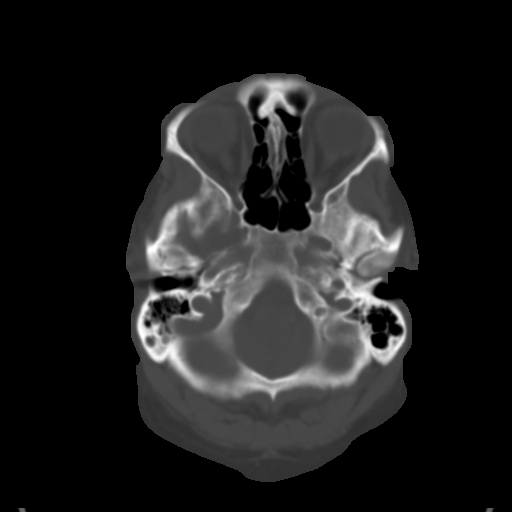
[im 6/31  brain]
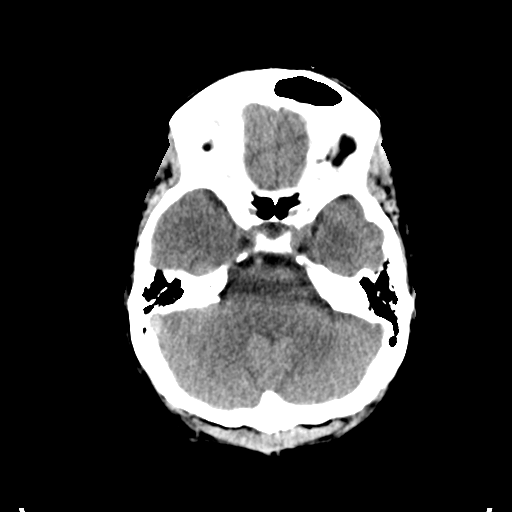
[im 9/31  brain]
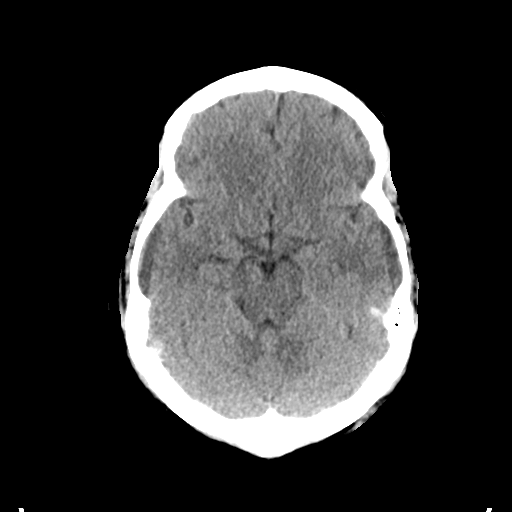
[im 11/31  brain]
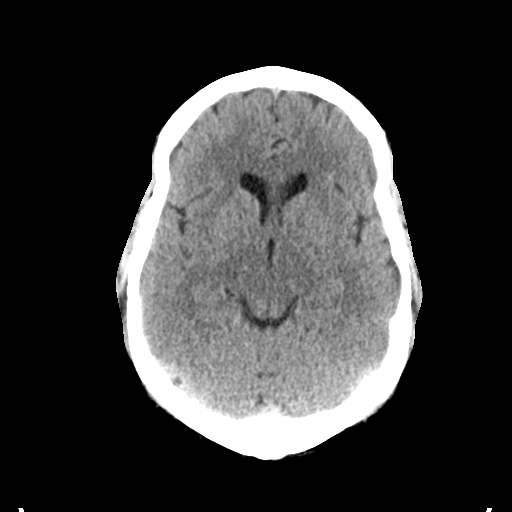
[im 14/31  brain]
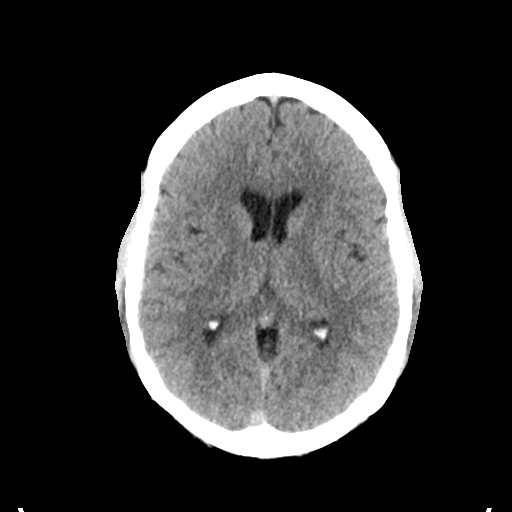
[im 14/31  bone]
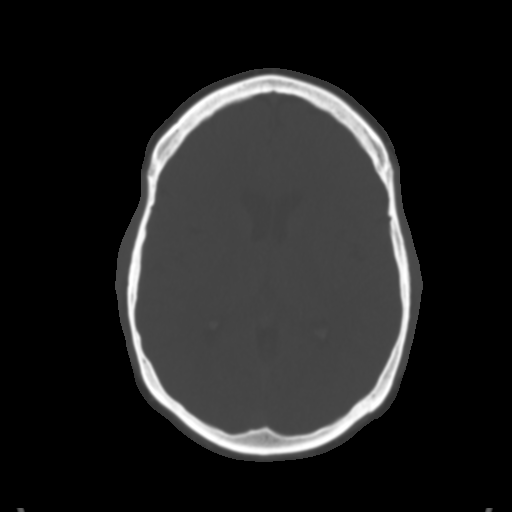
[im 17/31  brain]
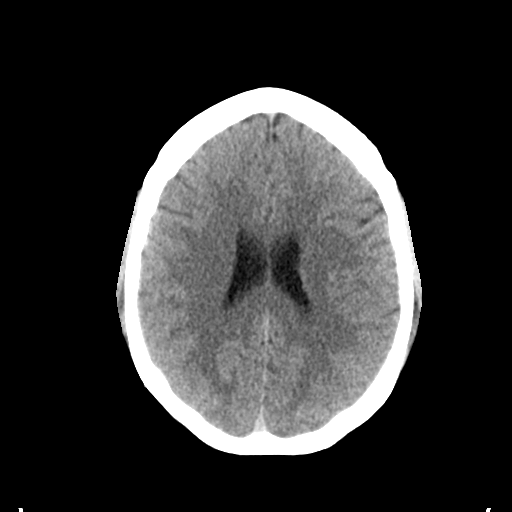
[im 20/31  brain]
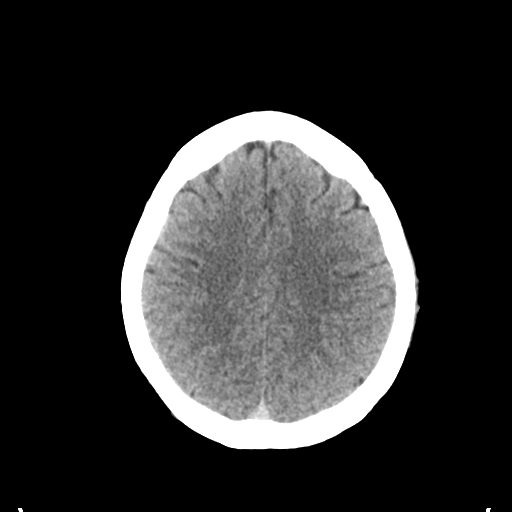
[im 23/31  brain]
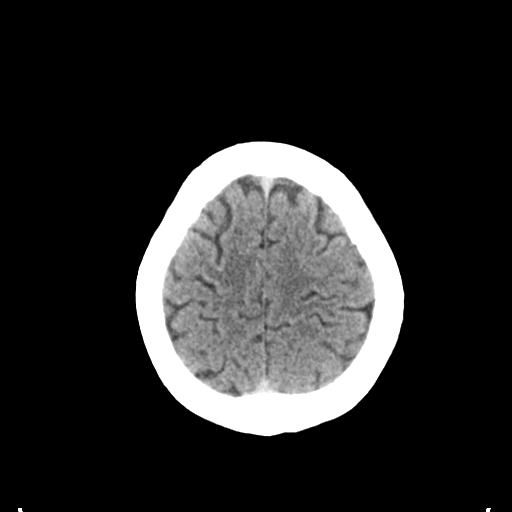
[im 25/31  brain]
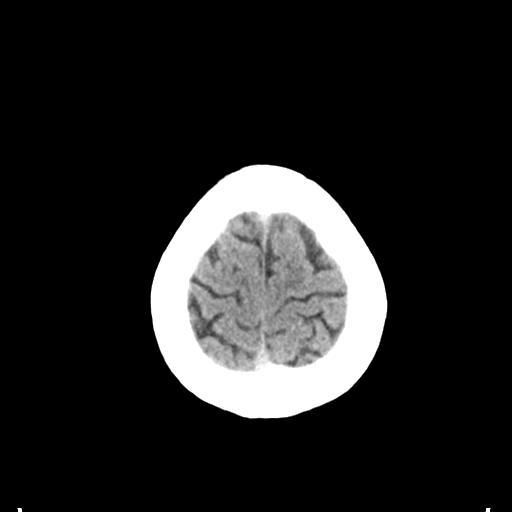
[im 25/31  bone]
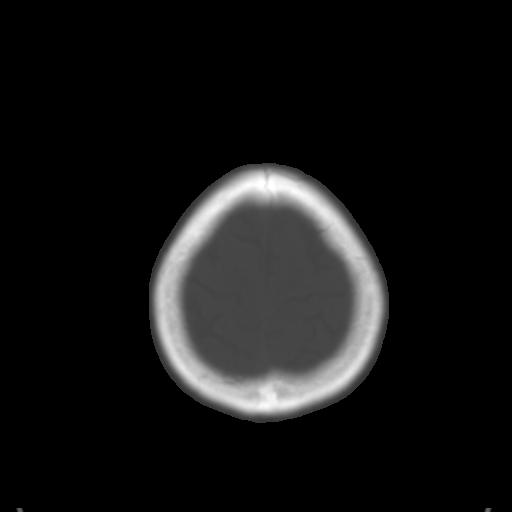
[im 28/31  brain]
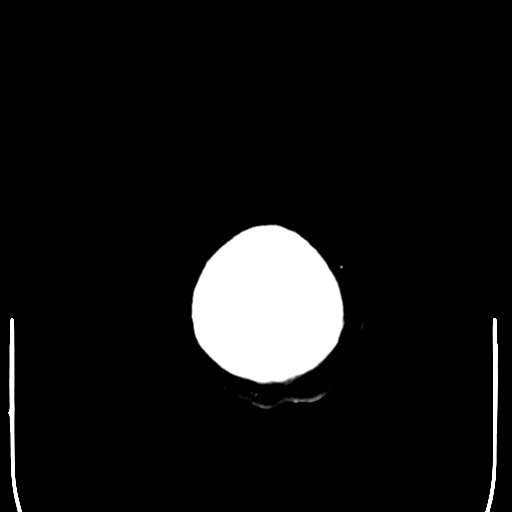

[Series 4: coronal soft tissue · coronal · 0.30mm/px · 3 of 67 slices shown]
[im 23/67  brain]
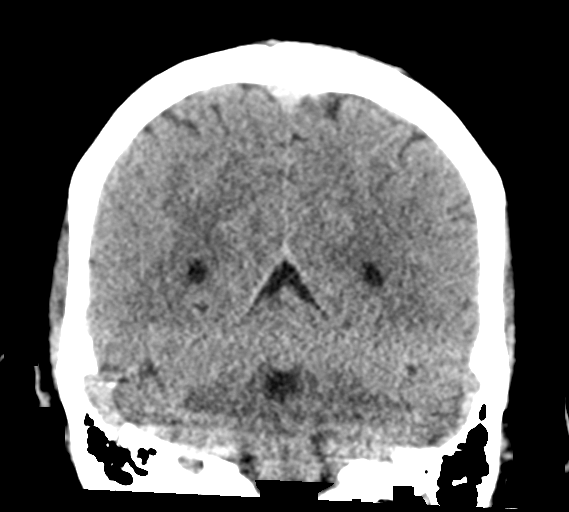
[im 30/67  brain]
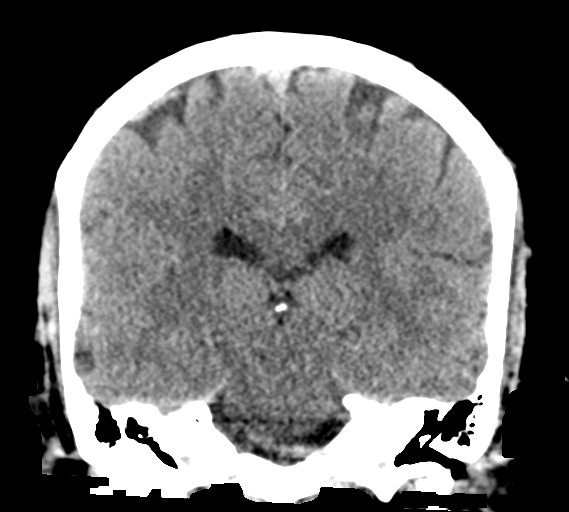
[im 37/67  brain]
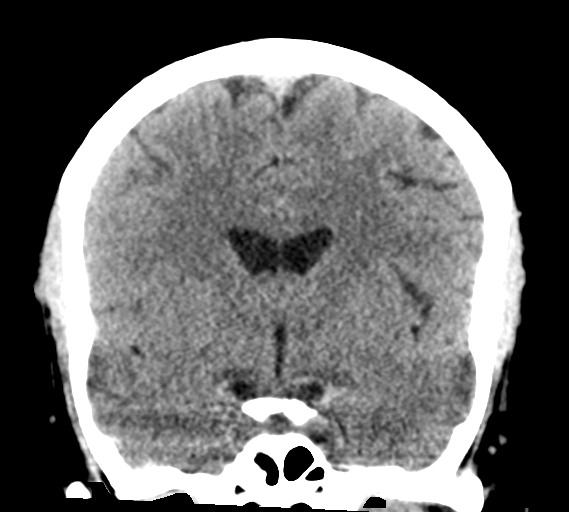

[Series 5: sagittal soft tissue · sagittal · 0.29mm/px · 3 of 53 slices shown]
[im 18/53  brain]
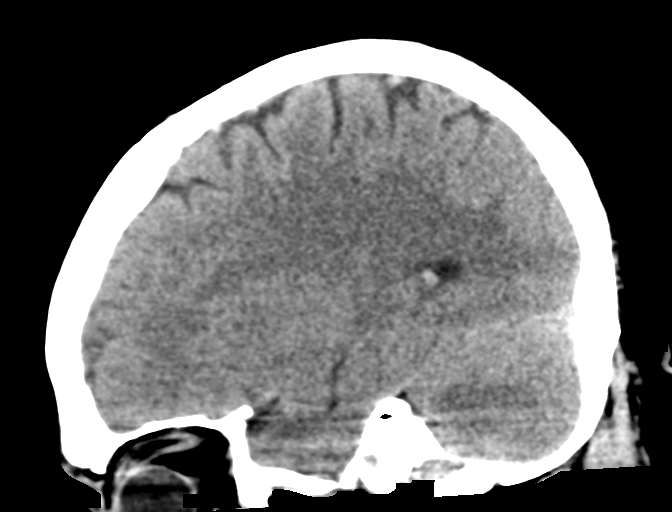
[im 27/53  brain]
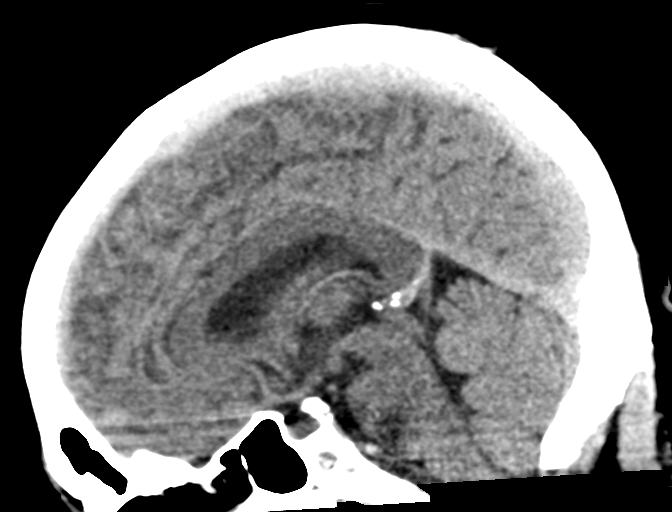
[im 35/53  brain]
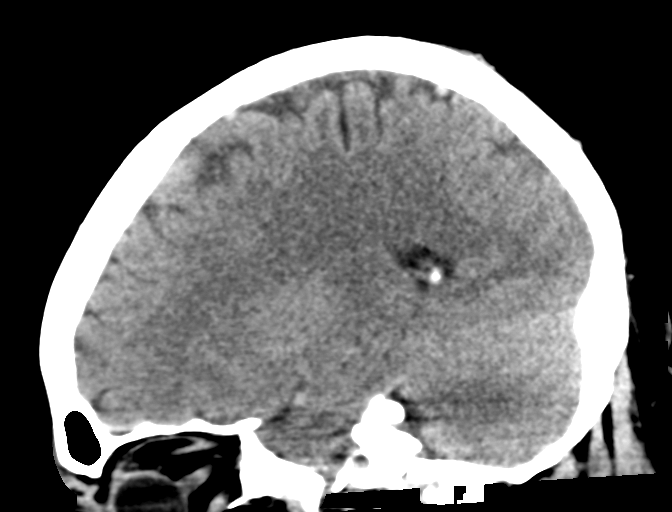

[16 of 47 positions shown; findings below may reference images not displayed]

FINDINGS: Brain: No subdural, epidural, or subarachnoid hemorrhage. Ventricles
and sulci are normal. Cerebellum, brainstem, and basal cisterns are
normal. No mass effect or midline shift. No acute cortical ischemia
or midline shift. No acute cortical ischemia or infarct identified.

Vascular: No hyperdense vessel or unexpected calcification.

Skull: Normal. Negative for fracture or focal lesion.

Sinuses/Orbits: No acute finding.

Other: None.
IMPRESSION: No acute intracranial abnormalities.

## 2022-03-14 NOTE — H&P (Signed)
TOTAL KNEE ADMISSION H&P  Patient is being admitted for right total knee arthroplasty.  Subjective:  Chief Complaint: Right knee pain.  HPI: Nathaniel Luna, 56 y.o. male has a history of pain and functional disability in the right knee due to arthritis and has failed non-surgical conservative treatments for greater than 12 weeks to include corticosteriod injections, viscosupplementation injections, and activity modification. Onset of symptoms was gradual, starting 5 years ago with gradually worsening course since that time. The patient noted prior procedures on the knee to include  arthroscopy and menisectomy on the right knee.  Patient currently rates pain in the right knee at 8 out of 10 with activity. Patient has worsening of pain with activity and weight bearing and pain that interferes with activities of daily living. Patient has evidence of subchondral sclerosis, periarticular osteophytes, and joint space narrowing by imaging studies. There is no active infection.  Patient Active Problem List   Diagnosis Date Noted   Obstructive apnea 04/01/2015   Abnormal WBC count 03/15/2015   Abnormality of plasma protein 03/15/2015   Pure hypercholesterolemia 03/15/2015   H/O renal calculi 03/14/2015   Microscopic hematuria 12/26/2014   Nephrolithiasis 12/02/2014   Right ureteral stone 12/02/2014   Type 2 diabetes mellitus ([place]) 12/01/2014   Gout 12/01/2014   Adiposity 12/01/2014   Urolithiasis -- was rx allopurinol 09/16/2014   Elevated uric acid in blood 09/16/2014   Advice or immunization for travel 05/21/2011   General medical examination 12/25/2010   Diabetes mellitus [facility])     Past Medical History:  Diagnosis Date   Adiposity 12/01/2014   Advice or immunization for travel 05/21/2011   Asthma    ? of asthma    Diabetes mellitus    Diabetes mellitus ([place])    General medical examination 12/25/2010   Gout 12/01/2014   Headache(784.0)    intense on-off, ibuprofen helps    Hearing  loss aprox. 2007   low tone decreased , R side, w/u neg per ENT   Microscopic hematuria    Seasonal allergies    Type 2 diabetes mellitus ([place]) 12/01/2014   Urolithiasis    while in [place] 09-2013    Past Surgical History:  Procedure Laterality Date   ELBOW SURGERY Left 2012   cubital tunnel syndrome    KNEE SURGERY  93   carthilage  repair post injury , R   WISDOM TOOTH EXTRACTION      Prior to Admission medications   Medication Sig Start Date End Date Taking? Authorizing Provider  allopurinol (ZYLOPRIM) 300 MG tablet Take 1 tablet (300 mg total) by mouth daily. 11/28/14   Colon Branch, MD  aspirin EC 325 MG tablet Take 325 mg by mouth daily. Reported on 03/27/2015    [provider]  atorvastatin (LIPITOR) 10 MG tablet Take by mouth. 03/15/15 03/14/16  [provider]  etodolac (LODINE) 500 MG tablet Take 1 tablet (500 mg total) by mouth 2 (two) times daily as needed. 12/26/14   Zara Council A, PA-C  glipiZIDE (GLUCOTROL XL) 5 MG 24 hr tablet Take by mouth. 03/15/15 03/14/16  [provider]  Glucosamine-Chondroit-Vit C-Mn (GLUCOSAMINE CHONDR 1500 COMPLX PO) Take by mouth. Reported on 03/27/2015    [provider]  HYDROcodone-acetaminophen (NORCO/VICODIN) 5-325 MG tablet Take 1 tablet by mouth every 6 (six) hours as needed for moderate pain. 12/26/14   Zara Council A, PA-C  metFORMIN (GLUCOPHAGE) 1000 MG tablet Take 1 tablet (1,000 mg total) by mouth 2 (two) times daily  with a meal. 09/26/14   Colon Branch, MD  Multiple Vitamin (MULTIVITAMIN WITH MINERALS) TABS tablet Take 1 tablet by mouth daily.    [provider]  ondansetron (ZOFRAN ODT) 4 MG disintegrating tablet Take 1 tablet (4 mg total) by mouth every 6 (six) hours as needed for nausea or vomiting. 11/25/14   Delman Kitten, MD  tamsulosin (FLOMAX) 0.4 MG CAPS capsule Take 1 capsule (0.4 mg total) by mouth daily. 03/10/15   McGowan, Hunt Oris, PA-C  UNABLE TO FIND Med Name: Cystone /  [employer]    [provider]  UNABLE TO FIND Med Name: Renalof ([employer])    [provider]  UNABLE TO FIND Med Name: [facility] Therapist, music)    [provider]    Allergies  Allergen Reactions   Codeine Nausea And Vomiting    Social History   Socioeconomic History   Marital status: Single    Spouse name: Not on file   Number of children: 0   Years of education: Not on file   Highest education level: Not on file  Occupational History   Occupation: Electrical engineer   Tobacco Use   Smoking status: Never   Smokeless tobacco: Never  Substance and Sexual Activity   Alcohol use: Yes    Comment: socially    Drug use: No   Sexual activity: Not on file  Other Topics Concern   Not on file  Social History Narrative   Pt is a Jehovah Witness, WON'T TAKE TRANSFUSSIONS    Lives in [place]  Employed by an [place] co   Emergency contact--    father [phone]   Work [phone]    Social Determinants of Health   Financial Resource Strain: Not on file  Food Insecurity: Not on file  Transportation Needs: Not on file  Physical Activity: Not on file  Stress: Not on file  Social Connections: Not on file  Intimate Partner Violence: Not on file    Tobacco Use: Low Risk  (09/13/2017)   Patient History    Smoking Tobacco Use: Never    Smokeless Tobacco Use: Never    Passive Exposure: Not on file   Social History   Substance and Sexual Activity  Alcohol Use Yes   Comment: socially     Family History  Problem Relation Age of Onset   Rheum arthritis Father    Prostate cancer Father 18   AAA (abdominal aortic aneurysm) Father        AAA father dx age 13 aproxand GF   Colon polyps Father        dx in his 3s   Breast cancer Mother    Stroke Mother    Dementia Mother    Diabetes Unknown        GF?   AAA (abdominal aortic aneurysm) Sister    Colon cancer Neg Hx    Coronary artery disease Neg Hx     ROS  Objective:  Physical Exam: The  patient is a pleasant, well-developed male alert and oriented in no apparent distress.  Right Knee Exam: No effusion present. No swelling present. Significant varus deformity. The range of motion is: 2 degrees of hyperextension to 125 degrees of flexion. No crepitus on range of motion of the knee. No medial joint line tenderness. No lateral joint line tenderness. Moderate varus/valgus laxity. No AP laxity.  Left Knee Exam: No effusion present. No swelling present. The Range of motion is: 0 to 125 degrees. Moderate crepitus on  range of motion of the knee. Positive medial greater than lateral joint line tenderness. Very mild laxity to varus/valgus laxity. No AP laxity.  The patient's sensation and motor function are intact in their lower extremities. Their distal pulses are 2+. The bilateral calves are soft and non-tender.  RESULTS  - AP and lateral of the bilateral knees dated 10/2021 demonstrate severe medial and patellofemoral arthritis in the right knee with significant tibial subluxation and varus deformity. The left knee shows bone on bone medial patellofemoral with varus but no tibial subluxation.    Assessment/Plan:  End stage arthritis, right knee   The patient history, physical examination, clinical judgment of the provider and imaging studies are consistent with end stage degenerative joint disease of the right knee and total knee arthroplasty is deemed medically necessary. The treatment options including medical management, injection therapy arthroscopy and arthroplasty were discussed at length. The risks and benefits of total knee arthroplasty were presented and reviewed. The risks due to aseptic loosening, infection, stiffness, patella tracking problems, thromboembolic complications and other imponderables were discussed. The patient acknowledged the explanation, agreed to proceed with the plan and consent was signed. Patient is being admitted for inpatient treatment for  surgery, pain control, PT, OT, prophylactic antibiotics, VTE prophylaxis, progressive ambulation and ADLs and discharge planning. The patient is planning to be discharged home with home health services.   Patient's anticipated LOS is less than 2 midnights, meeting these requirements: - Younger than 9 - Lives within 1 hour of care - Has a competent adult at home to recover with post-op recover - NO history of  - Chronic pain requiring opiods  - Coronary Artery Disease  - Heart failure  - Heart attack  - Stroke  - DVT/VTE  - Cardiac arrhythmia  - Respiratory Failure/COPD  - Anemia  - Advanced Liver disease  Therapy Plans: HHPT, does not have a ride to PT Disposition: Stay with father for 1 week, then home Planned DVT Prophylaxis: Aspirin DME Needed: None PCP: Gayland Curry, MD (clearance pending) TXA: IV Allergies: Codeine (vomiting) Anesthesia Concerns: None BMI: 39.9 Last HgbA1c: 6.6 (10/05/21, recheck at PAT)  Pharmacy: [facility]  Other: -Clearance form given to pt  - Patient was instructed on what medications to stop prior to surgery. - Follow-up visit in 2 weeks with Dr. Wynelle Link - Begin physical therapy following surgery - Pre-operative lab work as pre-surgical testing - Prescriptions will be provided in hospital at time of discharge  Shearon Balo, PA-C Orthopedic Surgery [facility] [place]

## 2022-03-19 NOTE — Patient Instructions (Signed)
SURGICAL WAITING ROOM VISITATION Patients having surgery or a procedure may have no more than 2 support people in the waiting area - these visitors may rotate.    If the patient needs to stay at the hospital during part of their recovery, the visitor guidelines for inpatient rooms apply. Pre-op nurse will coordinate an appropriate time for 1 support person to accompany patient in pre-op.  This support person may not rotate.    Please refer to the [facility] website for the visitor guidelines for Inpatients (after your surgery is over and you are in a regular room).   Due to an increase in RSV and influenza rates and associated hospitalizations, children ages 51 and under may not visit patients in [place].     Your procedure is scheduled on: 04-01-22   Report to [facility] Main Entrance    Report to admitting at 5:50 AM   Call this number if you have problems the morning of surgery [phone]   Do not eat food :After Midnight.   After Midnight you may have the following liquids until 5:20 AM DAY OF SURGERY  Water Non-Citrus Juices (without pulp, NO RED) Carbonated Beverages Black Coffee (NO MILK/CREAM OR CREAMERS, sugar ok)  Clear Tea (NO MILK/CREAM OR CREAMERS, sugar ok) regular and decaf                             Plain Jell-O (NO RED)                                           Fruit ices (not with fruit pulp, NO RED)                                     Popsicles (NO RED)                                                               Sports drinks like Gatorade (NO RED)                   The day of surgery:  Drink ONE (1) Pre-Surgery Clear G2 at 5:20 AM the morning of surgery. Drink in one sitting. Do not sip.  This drink was given to you during your hospital  pre-op appointment visit. Nothing else to drink after completing the Pre-Surgery Clear G2.          If you have questions, please contact your surgeon's office.   FOLLOW  ANY ADDITIONAL PRE  OP INSTRUCTIONS YOU RECEIVED FROM YOUR SURGEON'S OFFICE!!!     Oral Hygiene is also important to reduce your risk of infection.                                    Remember - BRUSH YOUR TEETH THE MORNING OF SURGERY WITH YOUR REGULAR TOOTHPASTE   Do NOT smoke after Midnight   Take these medicines the morning of surgery with A SIP OF WATER:  Allopurinol  Tamsulosin  Hydrocodone if needed  How to Manage Your Diabetes Before and After Surgery  Why is it important to control my blood sugar before and after surgery? Improving blood sugar levels before and after surgery helps healing and can limit problems. A way of improving blood sugar control is eating a healthy diet by:  Eating less sugar and carbohydrates  Increasing activity/exercise  Talking with your doctor about reaching your blood sugar goals High blood sugars (greater than 180 mg/dL) can raise your risk of infections and slow your recovery, so you will need to focus on controlling your diabetes during the weeks before surgery. Make sure that the doctor who takes care of your diabetes knows about your planned surgery including the date and location.  How do I manage my blood sugar before surgery? Check your blood sugar at least 4 times a day, starting 2 days before surgery, to make sure that the level is not too high or low. Check your blood sugar the morning of your surgery when you wake up and every 2 hours until you get to the Short Stay unit. If your blood sugar is less than 70 mg/dL, you will need to treat for low blood sugar: Do not take insulin. Treat a low blood sugar (less than 70 mg/dL) with  cup of clear juice (cranberry or apple), 4 glucose tablets, OR glucose gel. Recheck blood sugar in 15 minutes after treatment (to make sure it is greater than 70 mg/dL). If your blood sugar is not greater than 70 mg/dL on recheck, call [phone] for further instructions. Report your blood sugar to the short stay nurse when you  get to Short Stay.  If you are admitted to the hospital after surgery: Your blood sugar will be checked by the staff and you will probably be given insulin after surgery (instead of oral diabetes medicines) to make sure you have good blood sugar levels. The goal for blood sugar control after surgery is 80-180 mg/dL.   WHAT DO I DO ABOUT MY DIABETES MEDICATION?  Do not take oral diabetes medicines (pills) the morning of surgery.  Hold Trulicity 7 days before surgery (do not take after 03-24-22)  DO NOT TAKE THE FOLLOWING 7 DAYS PRIOR TO SURGERY: Ozempic, Wegovy, Rybelsus (Semaglutide), Byetta (exenatide), Bydureon (exenatide ER), Victoza, Saxenda (liraglutide), or Trulicity (dulaglutide) Mounjaro (Tirzepatide) Adlyxin (Lixisenatide), Polyethylene Glycol Loxenatide.  Reviewed and Endorsed by [facility] Patient Education Committee, August 2015  Bring CPAP mask and tubing day of surgery.                              You may not have any metal on your body including  jewelry, and body piercing             Do not wear lotions, powders, cologne, or deodorant              Men may shave face and neck.   Do not bring valuables to the hospital. [place].   Contacts, dentures or bridgework may not be worn into surgery.   Bring small overnight bag day of surgery.   DO NOT [place]. PHARMACY WILL DISPENSE MEDICATIONS LISTED ON YOUR MEDICATION LIST TO YOU DURING YOUR ADMISSION [place]!     Special Instructions: Bring a copy of your healthcare power of attorney and living will documents the  day of surgery if you haven't scanned them before.              Please read over the following fact sheets you were given: IF [place]  If you received a COVID test during your pre-op visit  it is requested that you wear a mask when out in public, stay away  from anyone that may not be feeling well and notify your surgeon if you develop symptoms. If you test positive for Covid or have been in contact with anyone that has tested positive in the last 10 days please notify you surgeon.  [place] - Preparing for Surgery Before surgery, you can play an important role.  Because skin is not sterile, your skin needs to be as free of germs as possible.  You can reduce the number of germs on your skin by washing with CHG (chlorahexidine gluconate) soap before surgery.  CHG is an antiseptic cleaner which kills germs and bonds with the skin to continue killing germs even after washing. Please DO NOT use if you have an allergy to CHG or antibacterial soaps.  If your skin becomes reddened/irritated stop using the CHG and inform your nurse when you arrive at Short Stay. Do not shave (including legs and underarms) for at least 48 hours prior to the first CHG shower.  You may shave your face/neck.  Please follow these instructions carefully:  1.  Shower with CHG Soap the night before surgery and the  morning of surgery.  2.  If you choose to wash your hair, wash your hair first as usual with your normal  shampoo.  3.  After you shampoo, rinse your hair and body thoroughly to remove the shampoo.                             4.  Use CHG as you would any other liquid soap.  You can apply chg directly to the skin and wash.  Gently with a scrungie or clean washcloth.  5.  Apply the CHG Soap to your body ONLY FROM THE NECK DOWN.   Do   not use on face/ open                           Wound or open sores. Avoid contact with eyes, ears mouth and   genitals (private parts).                       Wash face,  Genitals (private parts) with your normal soap.             6.  Wash thoroughly, paying special attention to the area where your    surgery  will be performed.  7.  Thoroughly rinse your body with warm water from the neck down.  8.  DO NOT shower/wash with your normal soap  after using and rinsing off the CHG Soap.                9.  Pat yourself dry with a clean towel.            10.  Wear clean pajamas.            11.  Place clean sheets on your bed the night of your first shower and do not  sleep with pets. Day of  Surgery : Do not apply any lotions/deodorants the morning of surgery.  Please wear clean clothes to the hospital/surgery center.  FAILURE TO FOLLOW THESE INSTRUCTIONS MAY RESULT IN THE CANCELLATION OF YOUR SURGERY  PATIENT SIGNATURE_________________________________  NURSE SIGNATURE__________________________________  ________________________________________________________________________    Adam Phenix  An incentive spirometer is a tool that can help keep your lungs clear and active. This tool measures how well you are filling your lungs with each breath. Taking long deep breaths may help reverse or decrease the chance of developing breathing (pulmonary) problems (especially infection) following: A long period of time when you are unable to move or be active. BEFORE THE PROCEDURE  If the spirometer includes an indicator to show your best effort, your nurse or respiratory therapist will set it to a desired goal. If possible, sit up straight or lean slightly forward. Try not to slouch. Hold the incentive spirometer in an upright position. INSTRUCTIONS FOR USE  Sit on the edge of your bed if possible, or sit up as far as you can in bed or on a chair. Hold the incentive spirometer in an upright position. Breathe out normally. Place the mouthpiece in your mouth and seal your lips tightly around it. Breathe in slowly and as deeply as possible, raising the piston or the ball toward the top of the column. Hold your breath for 3-5 seconds or for as long as possible. Allow the piston or ball to fall to the bottom of the column. Remove the mouthpiece from your mouth and breathe out normally. Rest for a few seconds and repeat Steps 1 through 7  at least 10 times every 1-2 hours when you are awake. Take your time and take a few normal breaths between deep breaths. The spirometer may include an indicator to show your best effort. Use the indicator as a goal to work toward during each repetition. After each set of 10 deep breaths, practice coughing to be sure your lungs are clear. If you have an incision (the cut made at the time of surgery), support your incision when coughing by placing a pillow or rolled up towels firmly against it. Once you are able to get out of bed, walk around indoors and cough well. You may stop using the incentive spirometer when instructed by your caregiver.  RISKS AND COMPLICATIONS Take your time so you do not get dizzy or light-headed. If you are in pain, you may need to take or ask for pain medication before doing incentive spirometry. It is harder to take a deep breath if you are having pain. AFTER USE Rest and breathe slowly and easily. It can be helpful to keep track of a log of your progress. Your caregiver can provide you with a simple table to help with this. If you are using the spirometer at home, follow these instructions: [place] IF:  You are having difficultly using the spirometer. You have trouble using the spirometer as often as instructed. Your pain medication is not giving enough relief while using the spirometer. You develop fever of 100.5 F (38.1 C) or higher. SEEK IMMEDIATE MEDICAL CARE IF:  You cough up bloody sputum that had not been present before. You develop fever of 102 F (38.9 C) or greater. You develop worsening pain at or near the incision site. MAKE SURE YOU:  Understand these instructions. Will watch your condition. Will get help right away if you are not doing well or get worse. Document Released: 06/03/2006 Document Revised: 04/15/2011 Document Reviewed: 08/04/2006  ExitCare Patient Information [address],  [place].   ________________________________________________________________________

## 2022-03-19 NOTE — Progress Notes (Addendum)
COVID Vaccine Completed:  Yes  Date of COVID positive in last 48 days:No  PCP - Sena Hitch, PA Cardiologist - N/A  Chest x-ray - N/A EKG - 03-20-22 Epic Stress Test - N/A ECHO - N/A Cardiac Cath - N/A Pacemaker/ICD device last checked: Spinal Cord Stimulator:N/A  Bowel Prep - N/A  Sleep Study - Yes, +sleep apnea CPAP - No  Fasting Blood Sugar -  Checks Blood Sugar - does not check   Trulicity on Wednesday Last dose of GLP1 agonist- 03-20-22 GLP1 instructions:  Do not take after 03-24-22   Last dose of SGLT-2 inhibitors-  N/A SGLT-2 instructions: N/A  Blood Thinner Instructions: Aspirin Instructions:  N/A Last Dose:  Activity level:  Can go up a flight of stairs and perform activities of daily living without stopping and without symptoms of chest pain or shortness of breath.  Anesthesia review: N/A  Patient denies shortness of breath, fever, cough and chest pain at PAT appointment  Patient verbalized understanding of instructions that were given to them at the PAT appointment. Patient was also instructed that they will need to review over the PAT instructions again at home before surgery.

## 2022-03-20 ENCOUNTER — Encounter (HOSPITAL_COMMUNITY)
Admission: RE | Admit: 2022-03-20 | Discharge: 2022-03-20 | Disposition: A | Discharge status: Home / Self Care | Payer: Managed Care, Other (non HMO) | Source: Ambulatory Visit | Attending: Orthopedic Surgery

## 2022-03-20 ENCOUNTER — Encounter (HOSPITAL_COMMUNITY): Payer: Self-pay

## 2022-03-20 ENCOUNTER — Other Ambulatory Visit: Payer: Self-pay

## 2022-03-20 VITALS — BP 130/88 | HR 88 | Temp 98.6°F | Resp 16 | Ht 70.0 in | Wt 287.2 lb

## 2022-03-20 DIAGNOSIS — E119 Type 2 diabetes mellitus without complications: Secondary | ICD-10-CM | POA: Insufficient documentation

## 2022-03-20 DIAGNOSIS — R9431 Abnormal electrocardiogram [ECG] [EKG]: Secondary | ICD-10-CM | POA: Diagnosis not present

## 2022-03-20 DIAGNOSIS — Z01818 Encounter for other preprocedural examination: Secondary | ICD-10-CM | POA: Diagnosis present

## 2022-03-20 HISTORY — DX: Personal history of urinary calculi: Z87.442

## 2022-03-20 HISTORY — DX: Other specified postprocedural states: R11.2

## 2022-03-20 HISTORY — DX: Other pulmonary collapse: J98.19

## 2022-03-20 HISTORY — DX: Unspecified osteoarthritis, unspecified site: M19.90

## 2022-03-20 HISTORY — DX: Gastro-esophageal reflux disease without esophagitis: K21.9

## 2022-03-20 HISTORY — DX: Other specified postprocedural states: Z98.890

## 2022-03-20 HISTORY — DX: Rider (driver) (passenger) of other motorcycle injured in unspecified traffic accident, initial encounter: V29.99XA

## 2022-03-20 LAB — HEMOGLOBIN A1C
Hgb A1c MFr Bld: 5.6 % (ref 4.8–5.6)
Mean Plasma Glucose: 114.02 mg/dL

## 2022-03-20 LAB — SURGICAL PCR SCREEN
MRSA, PCR: NEGATIVE
Staphylococcus aureus: NEGATIVE

## 2022-03-20 LAB — CBC
HCT: 48 % (ref 39.0–52.0)
Hemoglobin: 15.8 g/dL (ref 13.0–17.0)
MCH: 32.6 pg (ref 26.0–34.0)
MCHC: 32.9 g/dL (ref 30.0–36.0)
MCV: 99 fL (ref 80.0–100.0)
Platelets: 330 10*3/uL (ref 150–400)
RBC: 4.85 MIL/uL (ref 4.22–5.81)
RDW: 12.9 % (ref 11.5–15.5)
WBC: 10.5 10*3/uL (ref 4.0–10.5)
nRBC: 0 % (ref 0.0–0.2)

## 2022-03-20 LAB — BASIC METABOLIC PANEL
Anion gap: 15 (ref 5–15)
BUN: 19 mg/dL (ref 6–20)
CO2: 25 mmol/L (ref 22–32)
Calcium: 9 mg/dL (ref 8.9–10.3)
Chloride: 103 mmol/L (ref 98–111)
Creatinine, Ser: 0.98 mg/dL (ref 0.61–1.24)
GFR, Estimated: >60 mL/min (ref >60)
Glucose, Bld: 130 mg/dL — ABNORMAL HIGH (ref 70–99)
Potassium: 4 mmol/L (ref 3.5–5.1)
Sodium: 143 mmol/L (ref 135–145)

## 2022-03-20 LAB — NO BLOOD PRODUCTS

## 2022-03-20 LAB — GLUCOSE, CAPILLARY: Glucose-Capillary: 140 mg/dL — ABNORMAL HIGH (ref 70–99)

## 2022-03-30 NOTE — Anesthesia Preprocedure Evaluation (Signed)
Anesthesia Evaluation  Patient identified by MRN, date of birth, ID band Patient awake    Reviewed: Allergy & Precautions, H&P , NPO status , Patient's Chart, lab work & pertinent test results  History of Anesthesia Complications (+) PONV and history of anesthetic complications  Airway Mallampati: I  TM Distance: >3 FB Neck ROM: Full    Dental no notable dental hx. (+) Teeth Intact, Dental Advisory Given   Pulmonary neg pulmonary ROS, asthma , sleep apnea    Pulmonary exam normal breath sounds clear to auscultation       Cardiovascular Exercise Tolerance: Good negative cardio ROS Normal cardiovascular exam Rhythm:Regular Rate:Normal     Neuro/Psych  Headaches negative neurological ROS  negative psych ROS   GI/Hepatic negative GI ROS, Neg liver ROS,GERD  Medicated,,  Endo/Other  negative endocrine ROSdiabetes, Type 2    Renal/GU Renal diseasenegative Renal ROS  negative genitourinary   Musculoskeletal negative musculoskeletal ROS (+) Arthritis ,    Abdominal   Peds negative pediatric ROS (+)  Hematology negative hematology ROS (+)   Anesthesia Other Findings   Reproductive/Obstetrics negative OB ROS                             Anesthesia Physical Anesthesia Plan  ASA: 3  Anesthesia Plan: MAC, Spinal and Regional   Post-op Pain Management: Regional block*   Induction:   PONV Risk Score and Plan: 2 and Treatment may vary due to age or medical condition, Scopolamine patch - Pre-op and Propofol infusion  Airway Management Planned: Natural Airway and Simple Face Mask  Additional Equipment: None  Intra-op Plan:   Post-operative Plan:   Informed Consent: I have reviewed the patients History and Physical, chart, labs and discussed the procedure including the risks, benefits and alternatives for the proposed anesthesia with the patient or authorized representative who has indicated  his/her understanding and acceptance.       Plan Discussed with: Anesthesiologist  Anesthesia Plan Comments: (  )       Anesthesia Quick Evaluation

## 2022-04-01 ENCOUNTER — Inpatient Hospital Stay (HOSPITAL_COMMUNITY)
Admission: RE | Admit: 2022-04-01 | Discharge: 2022-04-03 | DRG: 470 | Disposition: A | Discharge status: Home Health | Payer: BC Managed Care – PPO | Attending: Orthopedic Surgery | Admitting: Orthopedic Surgery

## 2022-04-01 ENCOUNTER — Encounter (HOSPITAL_COMMUNITY): Payer: Self-pay | Admitting: Orthopedic Surgery

## 2022-04-01 ENCOUNTER — Other Ambulatory Visit: Payer: Self-pay

## 2022-04-01 ENCOUNTER — Ambulatory Visit (HOSPITAL_COMMUNITY): Payer: BC Managed Care – PPO | Admitting: Physician Assistant

## 2022-04-01 ENCOUNTER — Encounter (HOSPITAL_COMMUNITY)
Admission: RE | Disposition: A | Discharge status: Home Health | Payer: Self-pay | Source: Home / Self Care | Attending: Orthopedic Surgery

## 2022-04-01 ENCOUNTER — Ambulatory Visit (HOSPITAL_COMMUNITY): Payer: BC Managed Care – PPO | Admitting: Anesthesiology

## 2022-04-01 DIAGNOSIS — J45909 Unspecified asthma, uncomplicated: Secondary | ICD-10-CM | POA: Diagnosis present

## 2022-04-01 DIAGNOSIS — K219 Gastro-esophageal reflux disease without esophagitis: Secondary | ICD-10-CM | POA: Diagnosis present

## 2022-04-01 DIAGNOSIS — M109 Gout, unspecified: Secondary | ICD-10-CM | POA: Diagnosis present

## 2022-04-01 DIAGNOSIS — Z7982 Long term (current) use of aspirin: Secondary | ICD-10-CM

## 2022-04-01 DIAGNOSIS — E7800 Pure hypercholesterolemia, unspecified: Secondary | ICD-10-CM | POA: Diagnosis present

## 2022-04-01 DIAGNOSIS — Z885 Allergy status to narcotic agent status: Secondary | ICD-10-CM

## 2022-04-01 DIAGNOSIS — Z79899 Other long term (current) drug therapy: Secondary | ICD-10-CM

## 2022-04-01 DIAGNOSIS — H9191 Unspecified hearing loss, right ear: Secondary | ICD-10-CM | POA: Diagnosis present

## 2022-04-01 DIAGNOSIS — G4733 Obstructive sleep apnea (adult) (pediatric): Secondary | ICD-10-CM | POA: Diagnosis present

## 2022-04-01 DIAGNOSIS — Z6841 Body Mass Index (BMI) 40.0 and over, adult: Secondary | ICD-10-CM

## 2022-04-01 DIAGNOSIS — M179 Osteoarthritis of knee, unspecified: Principal | ICD-10-CM | POA: Diagnosis present

## 2022-04-01 DIAGNOSIS — M1711 Unilateral primary osteoarthritis, right knee: Principal | ICD-10-CM | POA: Diagnosis present

## 2022-04-01 DIAGNOSIS — Z7984 Long term (current) use of oral hypoglycemic drugs: Secondary | ICD-10-CM

## 2022-04-01 DIAGNOSIS — Z8261 Family history of arthritis: Secondary | ICD-10-CM

## 2022-04-01 DIAGNOSIS — E119 Type 2 diabetes mellitus without complications: Secondary | ICD-10-CM | POA: Diagnosis present

## 2022-04-01 DIAGNOSIS — Z87442 Personal history of urinary calculi: Secondary | ICD-10-CM

## 2022-04-01 HISTORY — PX: TOTAL KNEE ARTHROPLASTY: SHX125

## 2022-04-01 LAB — GLUCOSE, CAPILLARY
Glucose-Capillary: 126 mg/dL — ABNORMAL HIGH (ref 70–99)
Glucose-Capillary: 130 mg/dL — ABNORMAL HIGH (ref 70–99)
Glucose-Capillary: 193 mg/dL — ABNORMAL HIGH (ref 70–99)
Glucose-Capillary: 250 mg/dL — ABNORMAL HIGH (ref 70–99)
Glucose-Capillary: 245 mg/dL — ABNORMAL HIGH (ref 70–99)

## 2022-04-01 SURGERY — ARTHROPLASTY, KNEE, TOTAL
Anesthesia: Monitor Anesthesia Care | Site: Knee | Laterality: Right

## 2022-04-01 MED ORDER — ACETAMINOPHEN 10 MG/ML IV SOLN
1000.0000 mg | Freq: Once | INTRAVENOUS | Status: AC
  Administered 2022-04-01: 1000 mg via INTRAVENOUS
  Filled 2022-04-01: qty 100

## 2022-04-01 MED ORDER — POVIDONE-IODINE 10 % EX SWAB: 2.0000 | Freq: Once | CUTANEOUS | Status: DC

## 2022-04-01 MED ORDER — SODIUM CHLORIDE 0.9 % IV SOLN: INTRAVENOUS | Status: DC

## 2022-04-01 MED ORDER — CEFAZOLIN SODIUM-DEXTROSE 2-4 GM/100ML-% IV SOLN
2.0000 g | Freq: Four times a day (QID) | INTRAVENOUS | Status: AC
  Administered 2022-04-01 (×2): 2 g via INTRAVENOUS
  Filled 2022-04-01 (×2): qty 100

## 2022-04-01 MED ORDER — ACETAMINOPHEN 160 MG/5ML PO SOLN: 325.0000 mg | ORAL | Status: DC | PRN

## 2022-04-01 MED ORDER — HYDROMORPHONE HCL 1 MG/ML IJ SOLN
1.0000 mg | INTRAMUSCULAR | Status: DC | PRN
  Administered 2022-04-01: 1 mg via INTRAVENOUS
  Filled 2022-04-01: qty 1

## 2022-04-01 MED ORDER — TRAMADOL HCL 50 MG PO TABS
50.0000 mg | ORAL_TABLET | Freq: Four times a day (QID) | ORAL | Status: DC | PRN
  Administered 2022-04-01 – 2022-04-03 (×3): 100 mg via ORAL
  Filled 2022-04-01 (×4): qty 2

## 2022-04-01 MED ORDER — ORAL CARE MOUTH RINSE: 15.0000 mL | Freq: Once | OROMUCOSAL | Status: AC

## 2022-04-01 MED ORDER — ONDANSETRON HCL 4 MG PO TABS: 4.0000 mg | ORAL_TABLET | Freq: Four times a day (QID) | ORAL | Status: DC | PRN

## 2022-04-01 MED ORDER — ONDANSETRON HCL 4 MG/2ML IJ SOLN: 4.0000 mg | Freq: Four times a day (QID) | INTRAMUSCULAR | Status: DC | PRN

## 2022-04-01 MED ORDER — PROPOFOL 1000 MG/100ML IV EMUL
INTRAVENOUS | Status: AC
  Filled 2022-04-01: qty 300

## 2022-04-01 MED ORDER — OXYCODONE HCL 5 MG PO TABS: 5.0000 mg | ORAL_TABLET | Freq: Once | ORAL | Status: DC | PRN

## 2022-04-01 MED ORDER — ATORVASTATIN CALCIUM 10 MG PO TABS
10.0000 mg | ORAL_TABLET | Freq: Every day | ORAL | Status: DC
  Administered 2022-04-01 – 2022-04-03 (×3): 10 mg via ORAL
  Filled 2022-04-01 (×3): qty 1

## 2022-04-01 MED ORDER — ONDANSETRON HCL 4 MG/2ML IJ SOLN: 4.0000 mg | Freq: Once | INTRAMUSCULAR | Status: DC | PRN

## 2022-04-01 MED ORDER — LORATADINE 10 MG PO TABS
10.0000 mg | ORAL_TABLET | Freq: Every day | ORAL | Status: DC
  Administered 2022-04-02 (×2): 10 mg via ORAL
  Filled 2022-04-01 (×3): qty 1

## 2022-04-01 MED ORDER — MEPERIDINE HCL 50 MG/ML IJ SOLN: 6.2500 mg | INTRAMUSCULAR | Status: DC | PRN

## 2022-04-01 MED ORDER — CEFAZOLIN IN SODIUM CHLORIDE 3-0.9 GM/100ML-% IV SOLN
3.0000 g | INTRAVENOUS | Status: AC
  Administered 2022-04-01: 3 g via INTRAVENOUS
  Filled 2022-04-01: qty 100

## 2022-04-01 MED ORDER — ACETAMINOPHEN 325 MG PO TABS: 325.0000 mg | ORAL_TABLET | ORAL | Status: DC | PRN

## 2022-04-01 MED ORDER — MIDAZOLAM HCL 2 MG/2ML IJ SOLN
1.0000 mg | INTRAMUSCULAR | Status: DC
  Administered 2022-04-01: 2 mg via INTRAVENOUS
  Filled 2022-04-01: qty 2

## 2022-04-01 MED ORDER — PROPOFOL 1000 MG/100ML IV EMUL
INTRAVENOUS | Status: AC
  Filled 2022-04-01: qty 100

## 2022-04-01 MED ORDER — FENTANYL CITRATE PF 50 MCG/ML IJ SOSY: 25.0000 ug | PREFILLED_SYRINGE | INTRAMUSCULAR | Status: DC | PRN

## 2022-04-01 MED ORDER — SODIUM CHLORIDE (PF) 0.9 % IJ SOLN
INTRAMUSCULAR | Status: AC
  Filled 2022-04-01: qty 10

## 2022-04-01 MED ORDER — PROPOFOL 500 MG/50ML IV EMUL
INTRAVENOUS | Status: DC | PRN
  Administered 2022-04-01: 150 ug/kg/min via INTRAVENOUS

## 2022-04-01 MED ORDER — LACTATED RINGERS IV SOLN: INTRAVENOUS | Status: DC

## 2022-04-01 MED ORDER — PHENOL 1.4 % MT LIQD: 1.0000 | OROMUCOSAL | Status: DC | PRN

## 2022-04-01 MED ORDER — BUPIVACAINE IN DEXTROSE 0.75-8.25 % IT SOLN
INTRATHECAL | Status: DC | PRN
  Administered 2022-04-01: 2 mL via INTRATHECAL

## 2022-04-01 MED ORDER — DEXAMETHASONE SODIUM PHOSPHATE 10 MG/ML IJ SOLN
INTRAMUSCULAR | Status: AC
  Filled 2022-04-01: qty 2

## 2022-04-01 MED ORDER — FENTANYL CITRATE PF 50 MCG/ML IJ SOSY
50.0000 ug | PREFILLED_SYRINGE | INTRAMUSCULAR | Status: DC
  Administered 2022-04-01: 50 ug via INTRAVENOUS
  Filled 2022-04-01: qty 2

## 2022-04-01 MED ORDER — OXYCODONE HCL 5 MG/5ML PO SOLN: 5.0000 mg | Freq: Once | ORAL | Status: DC | PRN

## 2022-04-01 MED ORDER — BISACODYL 10 MG RE SUPP: 10.0000 mg | Freq: Every day | RECTAL | Status: DC | PRN

## 2022-04-01 MED ORDER — DEXAMETHASONE SODIUM PHOSPHATE 10 MG/ML IJ SOLN
10.0000 mg | Freq: Once | INTRAMUSCULAR | Status: AC
  Administered 2022-04-02: 10 mg via INTRAVENOUS
  Filled 2022-04-01: qty 1

## 2022-04-01 MED ORDER — SODIUM CHLORIDE (PF) 0.9 % IJ SOLN
INTRAMUSCULAR | Status: DC | PRN
  Administered 2022-04-01: 60 mL

## 2022-04-01 MED ORDER — BUPIVACAINE LIPOSOME 1.3 % IJ SUSP
INTRAMUSCULAR | Status: AC
  Filled 2022-04-01: qty 20

## 2022-04-01 MED ORDER — ONDANSETRON HCL 4 MG/2ML IJ SOLN
INTRAMUSCULAR | Status: AC
  Filled 2022-04-01: qty 4

## 2022-04-01 MED ORDER — SODIUM CHLORIDE 0.9 % IR SOLN
Status: DC | PRN
  Administered 2022-04-01: 1000 mL

## 2022-04-01 MED ORDER — ONDANSETRON HCL 4 MG/2ML IJ SOLN
INTRAMUSCULAR | Status: DC | PRN
  Administered 2022-04-01: 4 mg via INTRAVENOUS

## 2022-04-01 MED ORDER — DEXAMETHASONE SODIUM PHOSPHATE 4 MG/ML IJ SOLN
INTRAMUSCULAR | Status: DC | PRN
  Administered 2022-04-01: 8 mg via INTRAVENOUS

## 2022-04-01 MED ORDER — ACETAMINOPHEN 500 MG PO TABS
1000.0000 mg | ORAL_TABLET | Freq: Four times a day (QID) | ORAL | Status: AC
  Administered 2022-04-01 – 2022-04-02 (×3): 1000 mg via ORAL
  Filled 2022-04-01 (×3): qty 2

## 2022-04-01 MED ORDER — MENTHOL 3 MG MT LOZG: 1.0000 | LOZENGE | OROMUCOSAL | Status: DC | PRN

## 2022-04-01 MED ORDER — STERILE WATER FOR IRRIGATION IR SOLN
Status: DC | PRN
  Administered 2022-04-01: 2000 mL

## 2022-04-01 MED ORDER — TRANEXAMIC ACID-NACL 1000-0.7 MG/100ML-% IV SOLN
1000.0000 mg | INTRAVENOUS | Status: AC
  Administered 2022-04-01: 1000 mg via INTRAVENOUS
  Filled 2022-04-01: qty 100

## 2022-04-01 MED ORDER — METOCLOPRAMIDE HCL 5 MG/ML IJ SOLN
INTRAMUSCULAR | Status: DC | PRN
  Administered 2022-04-01 (×2): 5 mg via INTRAVENOUS

## 2022-04-01 MED ORDER — BUPIVACAINE LIPOSOME 1.3 % IJ SUSP
INTRAMUSCULAR | Status: DC | PRN
  Administered 2022-04-01: 20 mL

## 2022-04-01 MED ORDER — BUPIVACAINE LIPOSOME 1.3 % IJ SUSP: 20.0000 mL | Freq: Once | INTRAMUSCULAR | Status: DC

## 2022-04-01 MED ORDER — PROPOFOL 500 MG/50ML IV EMUL
INTRAVENOUS | Status: AC
  Filled 2022-04-01: qty 100

## 2022-04-01 MED ORDER — DOCUSATE SODIUM 100 MG PO CAPS
100.0000 mg | ORAL_CAPSULE | Freq: Two times a day (BID) | ORAL | Status: DC
  Administered 2022-04-01 – 2022-04-03 (×5): 100 mg via ORAL
  Filled 2022-04-01 (×5): qty 1

## 2022-04-01 MED ORDER — CHLORHEXIDINE GLUCONATE 0.12 % MT SOLN
15.0000 mL | Freq: Once | OROMUCOSAL | Status: AC
  Administered 2022-04-01: 15 mL via OROMUCOSAL

## 2022-04-01 MED ORDER — FAMOTIDINE 20 MG PO TABS
20.0000 mg | ORAL_TABLET | Freq: Every day | ORAL | Status: DC
  Administered 2022-04-01 – 2022-04-03 (×3): 20 mg via ORAL
  Filled 2022-04-01 (×3): qty 1

## 2022-04-01 MED ORDER — SODIUM CHLORIDE (PF) 0.9 % IJ SOLN
INTRAMUSCULAR | Status: AC
  Filled 2022-04-01: qty 50

## 2022-04-01 MED ORDER — METOCLOPRAMIDE HCL 5 MG/ML IJ SOLN: 5.0000 mg | Freq: Three times a day (TID) | INTRAMUSCULAR | Status: DC | PRN

## 2022-04-01 MED ORDER — METOCLOPRAMIDE HCL 5 MG PO TABS: 5.0000 mg | ORAL_TABLET | Freq: Three times a day (TID) | ORAL | Status: DC | PRN

## 2022-04-01 MED ORDER — GLIPIZIDE ER 10 MG PO TB24
10.0000 mg | ORAL_TABLET | Freq: Every day | ORAL | Status: DC
  Administered 2022-04-02 – 2022-04-03 (×2): 10 mg via ORAL
  Filled 2022-04-01 (×2): qty 1

## 2022-04-01 MED ORDER — PROPOFOL 10 MG/ML IV BOLUS
INTRAVENOUS | Status: DC | PRN
  Administered 2022-04-01: 100 mg via INTRAVENOUS

## 2022-04-01 MED ORDER — ALLOPURINOL 300 MG PO TABS
300.0000 mg | ORAL_TABLET | Freq: Every day | ORAL | Status: DC
  Administered 2022-04-02 – 2022-04-03 (×2): 300 mg via ORAL
  Filled 2022-04-01 (×2): qty 1

## 2022-04-01 MED ORDER — OXYCODONE HCL 5 MG PO TABS
5.0000 mg | ORAL_TABLET | ORAL | Status: DC | PRN
  Administered 2022-04-02 – 2022-04-03 (×8): 10 mg via ORAL
  Filled 2022-04-01 (×9): qty 2

## 2022-04-01 MED ORDER — ROPIVACAINE HCL 5 MG/ML IJ SOLN
INTRAMUSCULAR | Status: DC | PRN
  Administered 2022-04-01: 20 mL via PERINEURAL

## 2022-04-01 MED ORDER — PHENYLEPHRINE HCL (PRESSORS) 10 MG/ML IV SOLN
INTRAVENOUS | Status: AC
  Filled 2022-04-01: qty 1

## 2022-04-01 MED ORDER — DEXAMETHASONE SODIUM PHOSPHATE 10 MG/ML IJ SOLN: 8.0000 mg | Freq: Once | INTRAMUSCULAR | Status: DC

## 2022-04-01 MED ORDER — ASPIRIN 81 MG PO CHEW
81.0000 mg | CHEWABLE_TABLET | Freq: Two times a day (BID) | ORAL | Status: DC
  Administered 2022-04-02 – 2022-04-03 (×3): 81 mg via ORAL
  Filled 2022-04-01 (×3): qty 1

## 2022-04-01 MED ORDER — METHOCARBAMOL 500 MG IVPB - SIMPLE MED: 500.0000 mg | Freq: Four times a day (QID) | INTRAVENOUS | Status: DC | PRN

## 2022-04-01 MED ORDER — INSULIN ASPART 100 UNIT/ML IJ SOLN
0.0000 [IU] | Freq: Three times a day (TID) | INTRAMUSCULAR | Status: DC
  Administered 2022-04-01: 3 [IU] via SUBCUTANEOUS
  Administered 2022-04-01: 5 [IU] via SUBCUTANEOUS
  Administered 2022-04-02 (×2): 8 [IU] via SUBCUTANEOUS
  Administered 2022-04-02 – 2022-04-03 (×2): 3 [IU] via SUBCUTANEOUS
  Administered 2022-04-03: 8 [IU] via SUBCUTANEOUS

## 2022-04-01 MED ORDER — FLEET ENEMA 7-19 GM/118ML RE ENEM: 1.0000 | ENEMA | Freq: Once | RECTAL | Status: DC | PRN

## 2022-04-01 MED ORDER — DIPHENHYDRAMINE HCL 12.5 MG/5ML PO ELIX: 12.5000 mg | ORAL_SOLUTION | ORAL | Status: DC | PRN

## 2022-04-01 MED ORDER — POLYETHYLENE GLYCOL 3350 17 G PO PACK: 17.0000 g | PACK | Freq: Every day | ORAL | Status: DC | PRN

## 2022-04-01 MED ORDER — METHOCARBAMOL 500 MG PO TABS
500.0000 mg | ORAL_TABLET | Freq: Four times a day (QID) | ORAL | Status: DC | PRN
  Administered 2022-04-01 – 2022-04-03 (×7): 500 mg via ORAL
  Filled 2022-04-01 (×7): qty 1

## 2022-04-01 MED ORDER — ENALAPRIL MALEATE 2.5 MG PO TABS
2.5000 mg | ORAL_TABLET | Freq: Every day | ORAL | Status: DC
  Administered 2022-04-02 – 2022-04-03 (×2): 2.5 mg via ORAL
  Filled 2022-04-01 (×2): qty 1

## 2022-04-01 SURGICAL SUPPLY — 57 items
ATTUNE MED DOME PAT 38 KNEE (Knees) IMPLANT
ATTUNE PS FEM RT SZ 7 CEM KNEE (Femur) IMPLANT
ATTUNE PSRP INSR SZ7 10 KNEE (Insert) IMPLANT
BAG COUNTER SPONGE SURGICOUNT (BAG) IMPLANT
BAG ZIPLOCK 12X15 (MISCELLANEOUS) ×1 IMPLANT
BASE TIBIA ATTUNE KNEE SYS SZ6 (Knees) IMPLANT
BLADE SAG 18X100X1.27 (BLADE) ×1 IMPLANT
BLADE SAW SGTL 11.0X1.19X90.0M (BLADE) ×1 IMPLANT
BNDG ELASTIC 6INX 5YD STR LF (GAUZE/BANDAGES/DRESSINGS) ×1 IMPLANT
BNDG ELASTIC 6X5.8 VLCR STR LF (GAUZE/BANDAGES/DRESSINGS) IMPLANT
BOWL SMART MIX CTS (DISPOSABLE) ×1 IMPLANT
CEMENT HV SMART SET (Cement) ×2 IMPLANT
CLSR STERI-STRIP ANTIMIC 1/2X4 (GAUZE/BANDAGES/DRESSINGS) IMPLANT
COVER SURGICAL LIGHT HANDLE (MISCELLANEOUS) ×1 IMPLANT
CUFF TOURN SGL QUICK 34 (TOURNIQUET CUFF) ×1
CUFF TRNQT CYL 34X4.125X (TOURNIQUET CUFF) ×1 IMPLANT
DRAPE INCISE IOBAN 66X45 STRL (DRAPES) ×1 IMPLANT
DRAPE U-SHAPE 47X51 STRL (DRAPES) ×1 IMPLANT
DRESSING AQUACEL AG SP 3.5X10 (GAUZE/BANDAGES/DRESSINGS) IMPLANT
DRSG AQUACEL AG ADV 3.5X10 (GAUZE/BANDAGES/DRESSINGS) ×1 IMPLANT
DRSG AQUACEL AG SP 3.5X10 (GAUZE/BANDAGES/DRESSINGS) ×1
DURAPREP 26ML APPLICATOR (WOUND CARE) ×1 IMPLANT
ELECT REM PT RETURN 15FT ADLT (MISCELLANEOUS) ×1 IMPLANT
GLOVE BIO SURGEON STRL SZ 6.5 (GLOVE) IMPLANT
GLOVE BIO SURGEON STRL SZ7.5 (GLOVE) IMPLANT
GLOVE BIO SURGEON STRL SZ8 (GLOVE) ×1 IMPLANT
GLOVE BIOGEL PI IND STRL 6.5 (GLOVE) IMPLANT
GLOVE BIOGEL PI IND STRL 7.0 (GLOVE) IMPLANT
GLOVE BIOGEL PI IND STRL 8 (GLOVE) ×1 IMPLANT
GOWN STRL REUS W/ TWL LRG LVL3 (GOWN DISPOSABLE) ×1 IMPLANT
GOWN STRL REUS W/ TWL XL LVL3 (GOWN DISPOSABLE) IMPLANT
GOWN STRL REUS W/TWL LRG LVL3 (GOWN DISPOSABLE) ×1
GOWN STRL REUS W/TWL XL LVL3 (GOWN DISPOSABLE)
HANDPIECE INTERPULSE COAX TIP (DISPOSABLE) ×1
HOLDER FOLEY CATH W/STRAP (MISCELLANEOUS) IMPLANT
IMMOBILIZER KNEE 20 (SOFTGOODS) ×1
IMMOBILIZER KNEE 20 THIGH 36 (SOFTGOODS) ×1 IMPLANT
KIT TURNOVER KIT A (KITS) IMPLANT
MANIFOLD NEPTUNE II (INSTRUMENTS) ×1 IMPLANT
NS IRRIG 1000ML POUR BTL (IV SOLUTION) ×1 IMPLANT
PACK TOTAL KNEE CUSTOM (KITS) ×1 IMPLANT
PADDING CAST COTTON 6X4 STRL (CAST SUPPLIES) ×2 IMPLANT
PIN STEINMAN FIXATION KNEE (PIN) IMPLANT
PROTECTOR NERVE ULNAR (MISCELLANEOUS) ×1 IMPLANT
SET HNDPC FAN SPRY TIP SCT (DISPOSABLE) ×1 IMPLANT
SPIKE FLUID TRANSFER (MISCELLANEOUS) ×1 IMPLANT
STRIP CLOSURE SKIN 1/2X4 (GAUZE/BANDAGES/DRESSINGS) ×2 IMPLANT
SUT MNCRL AB 4-0 PS2 18 (SUTURE) ×1 IMPLANT
SUT STRATAFIX 0 PDS 27 VIOLET (SUTURE) ×1
SUT VIC AB 2-0 CT1 27 (SUTURE) ×3
SUT VIC AB 2-0 CT1 TAPERPNT 27 (SUTURE) ×3 IMPLANT
SUTURE STRATFX 0 PDS 27 VIOLET (SUTURE) ×1 IMPLANT
TIBIA ATTUNE KNEE SYS BASE SZ6 (Knees) ×1 IMPLANT
TRAY FOLEY MTR SLVR 16FR STAT (SET/KITS/TRAYS/PACK) ×1 IMPLANT
TUBE SUCTION HIGH CAP CLEAR NV (SUCTIONS) ×1 IMPLANT
WATER STERILE IRR 1000ML POUR (IV SOLUTION) ×2 IMPLANT
WRAP KNEE MAXI GEL POST OP (GAUZE/BANDAGES/DRESSINGS) ×1 IMPLANT

## 2022-04-01 NOTE — Plan of Care (Signed)
  Problem: Coping: Goal: Ability to adjust to condition or change in health will improve Outcome: Progressing   Problem: Pain Management: Goal: Pain level will decrease with appropriate interventions Outcome: Progressing   Problem: Nutrition: Goal: Adequate nutrition will be maintained Outcome: Progressing

## 2022-04-01 NOTE — Evaluation (Signed)
Physical Therapy Evaluation Patient Details Name: Nathaniel Luna MRN: [id number] DOB: 07/14/1965 Today's Date: 04/01/2022  History of Present Illness  57 yo male S/P RTKA 04/01/22. PMH: gout, CM, , OA  Clinical Impression  Pt admitted with above diagnosis.  Pt currently with functional limitations due to the deficits listed below (see PT Problem List). Pt will benefit from skilled PT to increase their independence and safety with mobility to allow discharge to the venue listed below.     The patient will need to be Modified independent at DC as patient's father unable to assist. Patient should progress. Well.     Recommendations for follow up therapy are one component of a multi-disciplinary discharge planning process, led by the attending physician.  Recommendations may be updated based on patient status, additional functional criteria and insurance authorization.  Follow Up Recommendations Follow physician's recommendations for discharge plan and follow up therapies      Assistance Recommended at Discharge PRN  Patient can return home with the following  Assistance with cooking/housework;Assist for transportation    Equipment Recommendations None recommended by PT  Recommendations for Other Services       Functional Status Assessment Patient has had a recent decline in their functional status and demonstrates the ability to make significant improvements in function in a reasonable and predictable amount of time.     Precautions / Restrictions Precautions Precautions: Knee;Fall Required Braces or Orthoses: Knee Immobilizer - Right Knee Immobilizer - Right: Discontinue once straight leg raise with < 10 degree lag      Mobility  Bed Mobility Overal bed mobility: Needs Assistance Bed Mobility: Supine to Sit     Supine to sit: Mod assist     General bed mobility comments: assist to pull to  sitting    Transfers Overall transfer level: Needs assistance Equipment used:  Rolling walker (2 wheels) Transfers: Sit to/from Stand, Bed to chair/wheelchair/BSC Sit to Stand: Mod assist   Step pivot transfers: Min assist       General transfer comment: cues for  RW  and to bear weight on UE's, still slightly unsteady in legs so limited  to recliner.    Ambulation/Gait                  Stairs            Wheelchair Mobility    Modified Rankin (Stroke Patients Only)       Balance Overall balance assessment: Mild deficits observed, not formally tested                                           Pertinent Vitals/Pain Pain Assessment Pain Assessment: 0-10 Pain Score: 3  Pain Location: right knee Pain Descriptors / Indicators: Discomfort Pain Intervention(s): Monitored during session, Patient requesting pain meds-RN notified    Home Living Family/patient expects to be discharged to:: Private residence Living Arrangements: Alone Available Help at Discharge: Family Type of Home: Apartment Home Access: Level entry       Home Layout: One level Home Equipment: Conservation officer, nature (2 wheels) Additional Comments: staying with father in handicaped accessible aprtment, father uses scooter and will not be of assistance    Prior Function Prior Level of Function : Independent/Modified Independent                     Hand Dominance  Extremity/Trunk Assessment   Upper Extremity Assessment Upper Extremity Assessment: Overall WFL for tasks assessed    Lower Extremity Assessment Lower Extremity Assessment: RLE deficits/detail RLE Deficits / Details: SLR with lag    Cervical / Trunk Assessment Cervical / Trunk Assessment: Normal  Communication   Communication: No difficulties  Cognition Arousal/Alertness: Awake/alert Behavior During Therapy: WFL for tasks assessed/performed Overall Cognitive Status: Within Functional Limits for tasks assessed                                           General Comments      Exercises     Assessment/Plan    PT Assessment Patient needs continued PT services  PT Problem List Decreased strength;Decreased range of motion;Decreased activity tolerance;Decreased mobility;Decreased knowledge of precautions;Pain       PT Treatment Interventions DME instruction;Therapeutic exercise;Gait training;Functional mobility training;Therapeutic activities;Patient/family education    PT Goals (Current goals can be found in the Care Plan section)  Acute Rehab PT Goals Patient Stated Goal: go home PT Goal Formulation: With patient Time For Goal Achievement: 04/08/22 Potential to Achieve Goals: Good    Frequency 7X/week     Co-evaluation               AM-PAC PT "6 Clicks" Mobility  Outcome Measure Help needed turning from your back to your side while in a flat bed without using bedrails?: A Lot Help needed moving from lying on your back to sitting on the side of a flat bed without using bedrails?: A Lot Help needed moving to and from a bed to a chair (including a wheelchair)?: A Lot Help needed standing up from a chair using your arms (e.g., wheelchair or bedside chair)?: A Lot Help needed to walk in hospital room?: A Lot Help needed climbing 3-5 steps with a railing? : A Lot 6 Click Score: 12    End of Session Equipment Utilized During Treatment: Gait belt;Right knee immobilizer Activity Tolerance: Patient tolerated treatment well Patient left: in chair;with call bell/phone within reach;with nursing/sitter in room;with chair alarm set Nurse Communication: Mobility status PT Visit Diagnosis: Unsteadiness on feet (R26.81)    Time: [id number] PT Time Calculation (min) (ACUTE ONLY): 33 min   Charges:   PT Evaluation $PT Eval Low Complexity: 1 Low PT Treatments $Gait Training: 8-22 mins        [place] Office [phone] Weekend [phone]   Claretha Cooper 04/01/2022, 5:46  PM

## 2022-04-01 NOTE — Anesthesia Procedure Notes (Signed)
Anesthesia Regional Block: Adductor canal block   Pre-Anesthetic Checklist: , timeout performed,  Correct Patient, Correct Site, Correct Laterality,  Correct Procedure, Correct Position, site marked,  Risks and benefits discussed,  Surgical consent,  Pre-op evaluation,  At surgeon's request and post-op pain management  Laterality: Right  Prep: chloraprep       Needles:  Injection technique: Single-shot  Needle Type: Echogenic Stimulator Needle     Needle Length: 5cm  Needle Gauge: 22     Additional Needles:   Procedures:, nerve stimulator,,, ultrasound used (permanent image in chart),,    Narrative:  Start time: 04/01/2022 7:45 AM End time: 04/01/2022 7:55 AM Injection made incrementally with aspirations every 5 mL.  Performed by: Personally  Anesthesiologist: Janeece Riggers, MD  Additional Notes: Functioning IV was confirmed and monitors were applied.  A 35m 22ga Arrow echogenic stimulator needle was used. Sterile prep and drape,hand hygiene and sterile gloves were used. Ultrasound guidance: relevant anatomy identified, needle position confirmed, local anesthetic spread visualized around nerve(s)., vascular puncture avoided.  Image printed for medical record. Negative aspiration and negative test dose prior to incremental administration of local anesthetic. The patient tolerated the procedure well.

## 2022-04-01 NOTE — Anesthesia Procedure Notes (Signed)
Spinal  Patient location during procedure: OR Start time: 04/01/2022 8:26 AM End time: 04/01/2022 8:39 AM Reason for block: surgical anesthesia Staffing Anesthesiologist: Janeece Riggers, MD Performed by: Janeece Riggers, MD Authorized by: Janeece Riggers, MD   Preanesthetic Checklist Completed: patient identified, IV checked, site marked, risks and benefits discussed, surgical consent, monitors and equipment checked, pre-op evaluation and timeout performed Spinal Block Patient position: sitting Prep: DuraPrep Patient monitoring: heart rate, cardiac monitor, continuous pulse ox and blood pressure Approach: midline Location: L4-5 Injection technique: single-shot Needle Needle type: Sprotte and Quincke  Needle gauge: 24 G Needle length: 9 cm Assessment Sensory level: T4 Events: CSF return Additional Notes Change to 22G

## 2022-04-01 NOTE — Discharge Instructions (Addendum)
Gaynelle Arabian, MD Total Joint Specialist [facility] [place] [address], [address] [place], [place] [address] [phone]  TOTAL KNEE REPLACEMENT POSTOPERATIVE DIRECTIONS    Knee Rehabilitation, Guidelines Following Surgery  Results after knee surgery are often greatly improved when you follow the exercise, range of motion and muscle strengthening exercises prescribed by your doctor. Safety measures are also important to protect the knee from further injury. If any of these exercises cause you to have increased pain or swelling in your knee joint, decrease the amount until you are comfortable again and slowly increase them. If you have problems or questions, call your caregiver or physical therapist for advice.   BLOOD CLOT PREVENTION Take 81 mg Aspirin two times a day for three weeks following surgery. Then take an 81 mg Aspirin once a day for three weeks. Then discontinue Aspirin. You may resume your vitamins/supplements upon discharge from the hospital. Do not take any NSAIDs (Advil, Aleve, Ibuprofen, Meloxicam, etc.) for 3 weeks, while taking 42m Aspirin twice a day.  HOME CARE INSTRUCTIONS  Remove items at home which could result in a fall. This includes throw rugs or furniture in walking pathways.  ICE to the affected knee as much as tolerated. Icing helps control swelling. If the swelling is well controlled you will be more comfortable and rehab easier. Continue to use ice on the knee for pain and swelling from surgery. You may notice swelling that will progress down to the foot and ankle. This is normal after surgery. Elevate the leg when you are not up walking on it.    Continue to use the breathing machine which will help keep your temperature down. It is common for your temperature to cycle up and down following surgery, especially at night when you are not up moving around and exerting yourself. The breathing machine keeps your lungs expanded and your temperature  down. Do not place pillow under the operative knee, focus on keeping the knee straight while resting  DIET You may resume your previous home diet once you are discharged from the hospital.  DRESSING / WOUND CARE / SHOWERING Keep your bulky bandage on for 2 days. On the third post-operative day you may remove the Ace bandage and gauze. There is a waterproof adhesive bandage on your skin which will stay in place until your first follow-up appointment. Once you remove this you will not need to place another bandage You may begin showering 3 days following surgery, but do not submerge the incision under water.  ACTIVITY For the first 5 days, the key is rest and control of pain and swelling Do your home exercises twice a day starting on post-operative day 3. On the days you go to physical therapy, just do the home exercises once that day. You should rest, ice and elevate the leg for 50 minutes out of every hour. Get up and walk/stretch for 10 minutes per hour. After 5 days you can increase your activity slowly as tolerated. Walk with your walker as instructed. Use the walker until you are comfortable transitioning to a cane. Walk with the cane in the opposite hand of the operative leg. You may discontinue the cane once you are comfortable and walking steadily. Avoid periods of inactivity such as sitting longer than an hour when not asleep. This helps prevent blood clots.  You may discontinue the knee immobilizer once you are able to perform a straight leg raise while lying down. You may resume a sexual relationship in one  month or when given the OK by your doctor.  You may return to work once you are cleared by your doctor.  Do not drive a car for 6 weeks or until released by your surgeon.  Do not drive while taking narcotics.  TED HOSE STOCKINGS Wear the elastic stockings on both legs for three weeks following surgery during the day. You may remove them at night for sleeping.  WEIGHT  BEARING Weight bearing as tolerated with assist device (walker, cane, etc) as directed, use it as long as suggested by your surgeon or therapist, typically at least 4-6 weeks.  POSTOPERATIVE CONSTIPATION PROTOCOL Constipation - defined medically as fewer than three stools per week and severe constipation as less than one stool per week.  One of the most common issues patients have following surgery is constipation.  Even if you have a regular bowel pattern at home, your normal regimen is likely to be disrupted due to multiple reasons following surgery.  Combination of anesthesia, postoperative narcotics, change in appetite and fluid intake all can affect your bowels.  In order to avoid complications following surgery, here are some recommendations in order to help you during your recovery period.  Colace (docusate) - Pick up an over-the-counter form of Colace or another stool softener and take twice a day as long as you are requiring postoperative pain medications.  Take with a full glass of water daily.  If you experience loose stools or diarrhea, hold the colace until you stool forms back up. If your symptoms do not get better within 1 week or if they get worse, check with your doctor. Dulcolax (bisacodyl) - Pick up over-the-counter and take as directed by the product packaging as needed to assist with the movement of your bowels.  Take with a full glass of water.  Use this product as needed if not relieved by Colace only.  MiraLax (polyethylene glycol) - Pick up over-the-counter to have on hand. MiraLax is a solution that will increase the amount of water in your bowels to assist with bowel movements.  Take as directed and can mix with a glass of water, juice, soda, coffee, or tea. Take if you go more than two days without a movement. Do not use MiraLax more than once per day. Call your doctor if you are still constipated or irregular after using this medication for 7 days in a row.  If you continue  to have problems with postoperative constipation, please contact the office for further assistance and recommendations.  If you experience "the worst abdominal pain ever" or develop nausea or vomiting, please contact the office immediatly for further recommendations for treatment.  ITCHING If you experience itching with your medications, try taking only a single pain pill, or even half a pain pill at a time.  You can also use Benadryl over the counter for itching or also to help with sleep.   MEDICATIONS See your medication summary on the "After Visit Summary" that the nursing staff will review with you prior to discharge.  You may have some home medications which will be placed on hold until you complete the course of blood thinner medication.  It is important for you to complete the blood thinner medication as prescribed by your surgeon.  Continue your approved medications as instructed at time of discharge.  PRECAUTIONS If you experience chest pain or shortness of breath - call 911 immediately for transfer to the hospital emergency department.  If you develop a fever greater that 101   F, purulent drainage from wound, increased redness or drainage from wound, foul odor from the wound/dressing, or calf pain - CONTACT YOUR SURGEON.                                                   FOLLOW-UP APPOINTMENTS Make sure you keep all of your appointments after your operation with your surgeon and caregivers. You should call the office at the above phone number and make an appointment for approximately two weeks after the date of your surgery or on the date instructed by your surgeon outlined in the "After Visit Summary".  RANGE OF MOTION AND STRENGTHENING EXERCISES  Rehabilitation of the knee is important following a knee injury or an operation. After just a few days of immobilization, the muscles of the thigh which control the knee become weakened and shrink (atrophy). Knee exercises are designed to build up  the tone and strength of the thigh muscles and to improve knee motion. Often times heat used for twenty to thirty minutes before working out will loosen up your tissues and help with improving the range of motion but do not use heat for the first two weeks following surgery. These exercises can be done on a training (exercise) mat, on the floor, on a table or on a bed. Use what ever works the best and is most comfortable for you Knee exercises include:  Leg Lifts - While your knee is still immobilized in a splint or cast, you can do straight leg raises. Lift the leg to 60 degrees, hold for 3 sec, and slowly lower the leg. Repeat 10-20 times 2-3 times daily. Perform this exercise against resistance later as your knee gets better.  Quad and Hamstring Sets - Tighten up the muscle on the front of the thigh (Quad) and hold for 5-10 sec. Repeat this 10-20 times hourly. Hamstring sets are done by pushing the foot backward against an object and holding for 5-10 sec. Repeat as with quad sets.  Leg Slides: Lying on your back, slowly slide your foot toward your buttocks, bending your knee up off the floor (only go as far as is comfortable). Then slowly slide your foot back down until your leg is flat on the floor again. Angel Wings: Lying on your back spread your legs to the side as far apart as you can without causing discomfort.  A rehabilitation program following serious knee injuries can speed recovery and prevent re-injury in the future due to weakened muscles. Contact your doctor or a physical therapist for more information on knee rehabilitation.   POST-OPERATIVE OPIOID TAPER INSTRUCTIONS: It is important to wean off of your opioid medication as soon as possible. If you do not need pain medication after your surgery it is ok to stop day one. Opioids include: Codeine, Hydrocodone(Norco, Vicodin), Oxycodone(Percocet, oxycontin) and hydromorphone amongst others.  Long term and even short term use of opiods can  cause: Increased pain response Dependence Constipation Depression Respiratory depression And more.  Withdrawal symptoms can include Flu like symptoms Nausea, vomiting And more Techniques to manage these symptoms Hydrate well Eat regular healthy meals Stay active Use relaxation techniques(deep breathing, meditating, yoga) Do Not substitute Alcohol to help with tapering If you have been on opioids for less than two weeks and do not have pain than it is ok to stop all together.    to wean off of opioids This plan should start within one week post op of your joint replacement. Maintain the same interval or time between taking each dose and first decrease the dose.  Cut the total daily intake of opioids by one tablet each day Next start to increase the time between doses. The last dose that should be eliminated is the evening dose.   IF YOU ARE TRANSFERRED TO A SKILLED REHAB FACILITY If the patient is transferred to a skilled rehab facility following release from the hospital, a list of the current medications will be sent to the facility for the patient to continue.  When discharged from the skilled rehab facility, please have the facility set up the patient's Home Health Physical Therapy prior to being released. Also, the skilled facility will be responsible for providing the patient with their medications at time of release from the facility to include their pain medication, the muscle relaxants, and their blood thinner medication. If the patient is still at the rehab facility at time of the two week follow up appointment, the skilled rehab facility will also need to assist the patient in arranging follow up appointment in our office and any transportation needs.  MAKE SURE YOU:  Understand these instructions.  Get help right away if you are not doing well or get worse.   DENTAL ANTIBIOTICS:  In most cases prophylactic antibiotics for Dental procdeures after total joint surgery are  not necessary.  Exceptions are as follows:  1. History of prior total joint infection  2. Severely immunocompromised (Organ Transplant, cancer chemotherapy, Rheumatoid biologic meds such as Humera)  3. Poorly controlled diabetes (A1C &gt; 8.0, blood glucose over 200)  If you have one of these conditions, contact your surgeon for an antibiotic prescription, prior to your dental procedure.    Pick up stool softner and laxative for home use following surgery while on pain medications. Do not submerge incision under water. Please use good hand washing techniques while changing dressing each day. May shower starting three days after surgery. Please use a clean towel to pat the incision dry following showers. Continue to use ice for pain and swelling after surgery. Do not use any lotions or creams on the incision until instructed by your surgeon.  

## 2022-04-01 NOTE — Progress Notes (Signed)
Patient requesting Claritin-D for nasal congestion. Attempted to reach [facility] answering service without answer. Notified Public house manager. Ivan Anchors, RN 04/01/22 8:05 PM

## 2022-04-01 NOTE — Op Note (Signed)
OPERATIVE REPORT-TOTAL KNEE ARTHROPLASTY   Pre-operative diagnosis- Osteoarthritis  Right knee(s)  Post-operative diagnosis- Osteoarthritis Right knee(s)  Procedure-  Right  Total Knee Arthroplasty  Surgeon- Dione Plover. Josceline Chenard, MD  Assistant- Shearon Balo, PA-C   Anesthesia-  GA combined with regional for post-op pain  EBL-50 mL   Drains None  Tourniquet time-  Total Tourniquet Time Documented: Thigh (Right) - 43 minutes Total: Thigh (Right) - 43 minutes     Complications- None  Condition-PACU - hemodynamically stable.   Brief Clinical Note  Nathaniel Luna is a 57 y.o. year old male with end stage OA of his right knee with progressively worsening pain and dysfunction. He has constant pain, with activity and at rest and significant functional deficits with difficulties even with ADLs. He has had extensive non-op management including analgesics, injections of cortisone and viscosupplements, and home exercise program, but remains in significant pain with significant dysfunction. Radiographs show bone on bone arthritis medial and patellofemoral. He presents now for right Total Knee Arthroplasty.     Procedure in detail---   The patient is brought into the operating room and positioned supine on the operating table. After successful administration of  GA combined with regional for post-op pain,   a tourniquet is placed high on the  Right thigh(s) and the lower extremity is prepped and draped in the usual sterile fashion. Time out is performed by the operating team and then the  Right lower extremity is wrapped in Esmarch, knee flexed and the tourniquet inflated to 300 mmHg.       A midline incision is made with a ten blade through the subcutaneous tissue to the level of the extensor mechanism. A fresh blade is used to make a medial parapatellar arthrotomy. Soft tissue over the proximal medial tibia is subperiosteally elevated to the joint line with a knife and into the  semimembranosus bursa with a Cobb elevator. Soft tissue over the proximal lateral tibia is elevated with attention being paid to avoiding the patellar tendon on the tibial tubercle. The patella is everted, knee flexed 90 degrees and the ACL and PCL are removed. Findings are bone on bone medial and patellofemoral with massive global osteophytes        The drill is used to create a starting hole in the distal femur and the canal is thoroughly irrigated with sterile saline to remove the fatty contents. The 5 degree Right  valgus alignment guide is placed into the femoral canal and the distal femoral cutting block is pinned to remove 9 mm off the distal femur. Resection is made with an oscillating saw.      The tibia is subluxed forward and the menisci are removed. The extramedullary alignment guide is placed referencing proximally at the medial aspect of the tibial tubercle and distally along the second metatarsal axis and tibial crest. The block is pinned to remove 2m off the more deficient medial  side. Resection is made with an oscillating saw. Size 6is the most appropriate size for the tibia and the proximal tibia is prepared with the modular drill and keel punch for that size.      The femoral sizing guide is placed and size 7 is most appropriate. Rotation is marked off the epicondylar axis and confirmed by creating a rectangular flexion gap at 90 degrees. The size 7 cutting block is pinned in this rotation and the anterior, posterior and chamfer cuts are made with the oscillating saw. The intercondylar block is then placed and  that cut is made.      Trial size 6 tibial component, trial size 7 posterior stabilized femur and a 10  mm posterior stabilized rotating platform insert trial is placed. Full extension is achieved with excellent varus/valgus and anterior/posterior balance throughout full range of motion. The patella is everted and thickness measured to be 25  mm. Free hand resection is taken to 15 mm,  a 38 template is placed, lug holes are drilled, trial patella is placed, and it tracks normally. Osteophytes are removed off the posterior femur with the trial in place. All trials are removed and the cut bone surfaces prepared with pulsatile lavage. Cement is mixed and once ready for implantation, the size 6 tibial implant, size  7 posterior stabilized femoral component, and the size 38 patella are cemented in place and the patella is held with the clamp. The trial insert is placed and the knee held in full extension. The Exparel (20 ml mixed with 60 ml saline) is injected into the extensor mechanism, posterior capsule, medial and lateral gutters and subcutaneous tissues.  All extruded cement is removed and once the cement is hard the permanent 10 mm posterior stabilized rotating platform insert is placed into the tibial tray.      The wound is copiously irrigated with saline solution and the extensor mechanism closed with # 0 Stratofix suture. The tourniquet is released for a total tourniquet time of 43  minutes. Flexion against gravity is 140 degrees and the patella tracks normally. Subcutaneous tissue is closed with 2.0 vicryl and subcuticular with running 4.0 Monocryl. The incision is cleaned and dried and steri-strips and a bulky sterile dressing are applied. The limb is placed into a knee immobilizer and the patient is awakened and transported to recovery in stable condition.      Please note that a surgical assistant was a medical necessity for this procedure in order to perform it in a safe and expeditious manner. Surgical assistant was necessary to retract the ligaments and vital neurovascular structures to prevent injury to them and also necessary for proper positioning of the limb to allow for anatomic placement of the prosthesis.   Dione Plover Aranda Bihm, MD    04/01/2022, 9:41 AM

## 2022-04-01 NOTE — Progress Notes (Signed)
Called PA on call per Off going RN request for home medication requested by patient.  Message left on voicemail.

## 2022-04-01 NOTE — Progress Notes (Signed)
Orthopedic Tech Progress Note Patient Details:  Nathaniel Luna [birth year] [id number]  CPM Right Knee CPM Right Knee: On Right Knee Flexion (Degrees): 40 Right Knee Extension (Degrees): 10  Post Interventions Patient Tolerated: Well  Vernona Rieger 04/01/2022, 10:38 AM

## 2022-04-01 NOTE — Progress Notes (Signed)
Orthopedic Tech Progress Note Patient Details:  Nathaniel Luna [birth year] [id number]  CPM Right Knee CPM Right Knee: Off Right Knee Flexion (Degrees): 40 Right Knee Extension (Degrees): 10  Post Interventions Patient Tolerated: Well  Vernona Rieger 04/01/2022, 2:58 PM

## 2022-04-01 NOTE — Interval H&P Note (Signed)
History and Physical Interval Note:  04/01/2022 6:28 AM  Nathaniel Luna  has presented today for surgery, with the diagnosis of right knee osteoarthritis.  The various methods of treatment have been discussed with the patient and family. After consideration of risks, benefits and other options for treatment, the patient has consented to  Procedure(s): TOTAL KNEE ARTHROPLASTY (Right) as a surgical intervention.  The patient's history has been reviewed, patient examined, no change in status, stable for surgery.  I have reviewed the patient's chart and labs.  Questions were answered to the patient's satisfaction.     [name]

## 2022-04-01 NOTE — Anesthesia Procedure Notes (Signed)
Procedure Name: LMA Insertion Date/Time: 04/01/2022 8:53 AM  Performed by: Jonna Munro, CRNAPre-anesthesia Checklist: Patient identified, Emergency Drugs available, Suction available, Patient being monitored and Timeout performed Patient Re-evaluated:Patient Re-evaluated prior to induction Oxygen Delivery Method: Circle system utilized Preoxygenation: Pre-oxygenation with 100% oxygen Induction Type: IV induction LMA: LMA inserted LMA Size: 5.0 Number of attempts: 1 Placement Confirmation: positive ETCO2, CO2 detector and breath sounds checked- equal and bilateral Tube secured with: Tape Dental Injury: Teeth and Oropharynx as per pre-operative assessment

## 2022-04-01 NOTE — Transfer of Care (Signed)
Immediate Anesthesia Transfer of Care Note  Patient: Nathaniel Luna  Procedure(s) Performed: TOTAL KNEE ARTHROPLASTY (Right: Knee)  Patient Location: PACU  Anesthesia Type:General  Level of Consciousness: awake, drowsy, and patient cooperative  Airway & Oxygen Therapy: Patient Spontanous Breathing and Patient connected to face mask oxygen  Post-op Assessment: Report given to RN and Post -op Vital signs reviewed and stable  Post vital signs: Reviewed and stable  Last Vitals:  Vitals Value Taken Time  BP 104/80 04/01/22 1006  Temp    Pulse 62 04/01/22 1009  Resp 14 04/01/22 1009  SpO2 100 % 04/01/22 1009  Vitals shown include unvalidated device data.  Last Pain:  Vitals:   04/01/22 0753  TempSrc:   PainSc: 0-No pain      Patients Stated Pain Goal: 4 ([id number])  Complications: No notable events documented.

## 2022-04-01 NOTE — Anesthesia Postprocedure Evaluation (Signed)
Anesthesia Post Note  Patient: Nathaniel Luna  Procedure(s) Performed: TOTAL KNEE ARTHROPLASTY (Right: Knee)     Patient location during evaluation: PACU Anesthesia Type: Regional Level of consciousness: oriented and awake and alert Pain management: pain level controlled Vital Signs Assessment: post-procedure vital signs reviewed and stable Respiratory status: spontaneous breathing, respiratory function stable and patient connected to nasal cannula oxygen Cardiovascular status: blood pressure returned to baseline and stable Postop Assessment: no headache, no backache and no apparent nausea or vomiting Anesthetic complications: no  No notable events documented.  Last Vitals:  Vitals:   04/01/22 0803 04/01/22 0813  BP: 137/82 131/76  Pulse: 64 70  Resp: 13 14  Temp:    SpO2: 97% 96%    Last Pain:  Vitals:   04/01/22 0753  TempSrc:   PainSc: 0-No pain                 Robley Matassa

## 2022-04-01 NOTE — Addendum Note (Signed)
Addendum  created 04/01/22 1320 by Jonna Munro, CRNA   Flowsheet accepted

## 2022-04-01 NOTE — Progress Notes (Signed)
Received callback from [place] and order as requested

## 2022-04-02 ENCOUNTER — Encounter (HOSPITAL_COMMUNITY): Payer: Self-pay | Admitting: Orthopedic Surgery

## 2022-04-02 LAB — CBC
HCT: 38 % — ABNORMAL LOW (ref 39.0–52.0)
Hemoglobin: 12.6 g/dL — ABNORMAL LOW (ref 13.0–17.0)
MCH: 32.6 pg (ref 26.0–34.0)
MCHC: 33.2 g/dL (ref 30.0–36.0)
MCV: 98.2 fL (ref 80.0–100.0)
Platelets: 283 10*3/uL (ref 150–400)
RBC: 3.87 MIL/uL — ABNORMAL LOW (ref 4.22–5.81)
RDW: 12.6 % (ref 11.5–15.5)
WBC: 15.5 10*3/uL — ABNORMAL HIGH (ref 4.0–10.5)
nRBC: 0 % (ref 0.0–0.2)

## 2022-04-02 LAB — BASIC METABOLIC PANEL
Anion gap: 9 (ref 5–15)
BUN: 17 mg/dL (ref 6–20)
CO2: 25 mmol/L (ref 22–32)
Calcium: 8.2 mg/dL — ABNORMAL LOW (ref 8.9–10.3)
Chloride: 104 mmol/L (ref 98–111)
Creatinine, Ser: 0.81 mg/dL (ref 0.61–1.24)
GFR, Estimated: >60 mL/min (ref >60)
Glucose, Bld: 203 mg/dL — ABNORMAL HIGH (ref 70–99)
Potassium: 3.8 mmol/L (ref 3.5–5.1)
Sodium: 138 mmol/L (ref 135–145)

## 2022-04-02 LAB — GLUCOSE, CAPILLARY
Glucose-Capillary: 158 mg/dL — ABNORMAL HIGH (ref 70–99)
Glucose-Capillary: 266 mg/dL — ABNORMAL HIGH (ref 70–99)
Glucose-Capillary: 256 mg/dL — ABNORMAL HIGH (ref 70–99)
Glucose-Capillary: 190 mg/dL — ABNORMAL HIGH (ref 70–99)

## 2022-04-02 NOTE — Progress Notes (Signed)
Orthopedic Tech Progress Note Patient Details:  Nathaniel Luna [birth year] [id number]  CPM Right Knee CPM Right Knee: On Right Knee Flexion (Degrees): 40 Right Knee Extension (Degrees): 10 Additional Comments: Pt wanted to start at 40 degrees and increase up to 50 once pain meds were given.  Educated nursing on how to adjust up to 50 degrees.  They also voiced understanding of how to take pt off of the machine after his 4 hours or sooner if he does not tolerate.  Post Interventions Patient Tolerated: Well Ortho Devices Type of Ortho Device: CPM padding   Post Interventions Patient Tolerated: Well  Noel Henandez OTR/L 04/02/2022, 6:45 PM

## 2022-04-02 NOTE — Progress Notes (Signed)
Physical Therapy Treatment Patient Details Name: Nathaniel Luna MRN: [id number] DOB: June 03, 1965 Today's Date: 04/02/2022   History of Present Illness 57 yo male S/P RTKA 04/01/22. PMH: gout, CM, , OA    PT Comments    POD # 1 Assisted with amb an increased distance.  General transfer comment: increased ability to self rise using B UE's to push self to upright.General Gait Details: tolerated amb an increased distance with recliner following as a precaution. Pt still having difficulty getting up from recliner and unsteady with amb. Pt has NOT yet met mobility goals to safely manage at home.   Reported to RN.   Recommendations for follow up therapy are one component of a multi-disciplinary discharge planning process, led by the attending physician.  Recommendations may be updated based on patient status, additional functional criteria and insurance authorization.  Follow Up Recommendations  Follow physician's recommendations for discharge plan and follow up therapies     Assistance Recommended at Discharge PRN  Patient can return home with the following Assistance with cooking/housework;Assist for transportation   Equipment Recommendations  None recommended by PT    Recommendations for Other Services       Precautions / Restrictions Precautions Precautions: Knee;Fall Precaution Comments: no pillow under knee Required Braces or Orthoses: Knee Immobilizer - Right Knee Immobilizer - Right: Discontinue once straight leg raise with < 10 degree lag Restrictions Weight Bearing Restrictions: No RLE Weight Bearing: Weight bearing as tolerated     Mobility  Bed Mobility Overal bed mobility: Needs Assistance Bed Mobility: Supine to Sit     Supine to sit: Min guard, Min assist     General bed mobility comments: OOB in recliner    Transfers Overall transfer level: Needs assistance Equipment used: Rolling walker (2 wheels) Transfers: Sit to/from Stand Sit to Stand: Supervision,  Min guard   Step pivot transfers: Min assist       General transfer comment: increased ability to self rise using B UE's to push self to upright.    Ambulation/Gait Ambulation/Gait assistance: Supervision, Min guard Gait Distance (Feet): 43 Feet Assistive device: Rolling walker (2 wheels) Gait Pattern/deviations: Step-to pattern, Decreased stance time - left Gait velocity: decreased     General Gait Details: tolerated amb an increased distance with recliner following as a precaution.   Stairs             Wheelchair Mobility    Modified Rankin (Stroke Patients Only)       Balance                                            Cognition Arousal/Alertness: Awake/alert Behavior During Therapy: WFL for tasks assessed/performed Overall Cognitive Status: Within Functional Limits for tasks assessed                                 General Comments: AxO x 3 very pleasant and motivated.  Plans to stay with his elder father        Exercises      General Comments        Pertinent Vitals/Pain Pain Assessment Pain Assessment: 0-10 Pain Score: 7  Pain Location: right knee Pain Descriptors / Indicators: Discomfort, Operative site guarding Pain Intervention(s): Monitored during session, Premedicated before session, Repositioned, Ice applied    Home Living  Prior Function            PT Goals (current goals can now be found in the care plan section) Progress towards PT goals: Progressing toward goals    Frequency    7X/week      PT Plan Current plan remains appropriate    Co-evaluation              AM-PAC PT "6 Clicks" Mobility   Outcome Measure  Help needed turning from your back to your side while in a flat bed without using bedrails?: A Lot Help needed moving from lying on your back to sitting on the side of a flat bed without using bedrails?: A Lot Help needed moving to and  from a bed to a chair (including a wheelchair)?: A Lot Help needed standing up from a chair using your arms (e.g., wheelchair or bedside chair)?: A Lot Help needed to walk in hospital room?: A Lot Help needed climbing 3-5 steps with a railing? : A Lot 6 Click Score: 12    End of Session Equipment Utilized During Treatment: Gait belt Activity Tolerance: Patient tolerated treatment well Patient left: in chair;with call bell/phone within reach;with nursing/sitter in room;with chair alarm set Nurse Communication: Mobility status PT Visit Diagnosis: Unsteadiness on feet (R26.81)     Time: 1405-1430 PT Time Calculation (min) (ACUTE ONLY): 25 min  Charges:  $Gait Training: 8-22 mins $Therapeutic Activity: 8-22 mins                     {Sayda Grable  PTA Acute  [employer] M-F          [phone] Weekend pager [phone]
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
## 2022-04-02 NOTE — Plan of Care (Signed)
  Problem: Coping: Goal: Ability to adjust to condition or change in health will improve Outcome: Progressing   Problem: Skin Integrity: Goal: Risk for impaired skin integrity will decrease Outcome: Progressing   Problem: Education: Goal: Knowledge of the prescribed therapeutic regimen will improve Outcome: Progressing Goal: Individualized Educational Video(s) Outcome: Progressing

## 2022-04-02 NOTE — Progress Notes (Deleted)
Orthopedic Tech Progress Note Patient Details:  Nathaniel Luna [birth year] [id number]  CPM Right Knee CPM Right Knee: On Right Knee Flexion (Degrees): 40 Right Knee Extension (Degrees): 10 Additional Comments: Pt wanted to start at 40 degrees and increase up to 50 once pain meds were given.  Educated nursing on how to adjust up to 50 degrees.  They also voiced understanding of how to take pt off of the machine after his 4 hours or sooner if he does not tolerate.  Post Interventions Patient Tolerated: Well  Nathaniel Luna OTR/L 04/02/2022, 6:44 PM

## 2022-04-02 NOTE — Progress Notes (Signed)
Physical Therapy Treatment Patient Details Name: Nathaniel Luna MRN: [id number] DOB: 1965/03/05 Today's Date: 04/02/2022   History of Present Illness 57 yo male S/P RTKA 04/01/22. PMH: gout, CM, , OA    PT Comments    POD # 1 second am session Pt tolerated amb an increased distance.  Then returned to room to perform some TE's following HEP handout.  Instructed on proper tech, freq as well as use of ICE.   Will see pt again this afternoon.    Recommendations for follow up therapy are one component of a multi-disciplinary discharge planning process, led by the attending physician.  Recommendations may be updated based on patient status, additional functional criteria and insurance authorization.  Follow Up Recommendations  Follow physician's recommendations for discharge plan and follow up therapies     Assistance Recommended at Discharge PRN  Patient can return home with the following Assistance with cooking/housework;Assist for transportation   Equipment Recommendations  None recommended by PT    Recommendations for Other Services       Precautions / Restrictions Precautions Precautions: Knee;Fall Required Braces or Orthoses: Knee Immobilizer - Right Knee Immobilizer - Right: Discontinue once straight leg raise with < 10 degree lag Restrictions Weight Bearing Restrictions: No RLE Weight Bearing: Weight bearing as tolerated     Mobility  Bed Mobility     General bed mobility comments: OOB in recliner    Transfers Overall transfer level: Needs assistance Equipment used: Rolling walker (2 wheels) Transfers: Sit to/from Stand Sit to Stand: Supervision, Min guard           General transfer comment: increased ability to self rise using B UE's to push self to upright.    Ambulation/Gait Ambulation/Gait assistance: Supervision, Min guard Gait Distance (Feet): 24 Feet Assistive device: Rolling walker (2 wheels) Gait Pattern/deviations: Step-to pattern, Decreased  stance time - left Gait velocity: decreased     General Gait Details: tolerated amb an increased distance with recliner following as a precaution.   Stairs             Wheelchair Mobility    Modified Rankin (Stroke Patients Only)       Balance                                            Cognition Arousal/Alertness: Awake/alert Behavior During Therapy: WFL for tasks assessed/performed Overall Cognitive Status: Within Functional Limits for tasks assessed                                 General Comments: AxO x 3 very pleasant and motivated.  Plans to stay with his elder father        Exercises  Total Knee Replacement TE's following HEP handout 10 reps B LE ankle pumps 05 reps towel squeezes 05 reps knee presses 05 reps heel slides  05 reps SAQ's 05 reps SLR's 05 reps ABD Educated on use of gait belt to assist with TE's Followed by ICE     General Comments        Pertinent Vitals/Pain Pain Assessment Pain Assessment: 0-10 Pain Score: 5  Pain Location: right knee Pain Descriptors / Indicators: Discomfort, Operative site guarding Pain Intervention(s): Monitored during session, Premedicated before session, Repositioned, Ice applied    Home Living  Prior Function            PT Goals (current goals can now be found in the care plan section) Progress towards PT goals: Progressing toward goals    Frequency    7X/week      PT Plan Current plan remains appropriate    Co-evaluation              AM-PAC PT "6 Clicks" Mobility   Outcome Measure  Help needed turning from your back to your side while in a flat bed without using bedrails?: A Lot Help needed moving from lying on your back to sitting on the side of a flat bed without using bedrails?: A Lot Help needed moving to and from a bed to a chair (including a wheelchair)?: A Lot Help needed standing up from a chair using  your arms (e.g., wheelchair or bedside chair)?: A Lot Help needed to walk in hospital room?: A Lot Help needed climbing 3-5 steps with a railing? : A Lot 6 Click Score: 12    End of Session Equipment Utilized During Treatment: Gait belt Activity Tolerance: Patient tolerated treatment well Patient left: in chair;with call bell/phone within reach;with nursing/sitter in room;with chair alarm set Nurse Communication: Mobility status PT Visit Diagnosis: Unsteadiness on feet (R26.81)     Time: [id number] PT Time Calculation (min) (ACUTE ONLY): 24 min  Charges:  $Gait Training: 8-22 mins $Therapeutic Exercise: 8-22 mins                     Rica Koyanagi  PTA [place] [facility] M-F          [phone] Weekend pager [phone]

## 2022-04-02 NOTE — Progress Notes (Signed)
Physical Therapy Treatment Patient Details Name: Nathaniel Luna MRN: [id number] DOB: [birth year] Today's Date: 04/02/2022   History of Present Illness 57 yo male S/P RTKA 04/01/22. PMH: gout, CM, , OA    PT Comments    POD # 1 am session limited Assisted OOB.  General bed mobility comments: demonstarted and instructed how to use a belt to self assist LE.  General transfer comment: increased effort and attempts to rise even from elevated surface. General Gait Details: limited distance due to fatigue/effort and increased c/o R anterior ankle pain.  Recliner following for safety. Applied ICE to R ankle and Right knee.  Will return a little later after pain subsides.   Recommendations for follow up therapy are one component of a multi-disciplinary discharge planning process, led by the attending physician.  Recommendations may be updated based on patient status, additional functional criteria and insurance authorization.  Follow Up Recommendations  Follow physician's recommendations for discharge plan and follow up therapies     Assistance Recommended at Discharge PRN  Patient can return home with the following Assistance with cooking/housework;Assist for transportation   Equipment Recommendations  None recommended by PT    Recommendations for Other Services       Precautions / Restrictions Precautions Precautions: Knee;Fall Required Braces or Orthoses: Knee Immobilizer - Right Knee Immobilizer - Right: Discontinue once straight leg raise with < 10 degree lag Restrictions Weight Bearing Restrictions: No RLE Weight Bearing: Weight bearing as tolerated     Mobility  Bed Mobility Overal bed mobility: Needs Assistance Bed Mobility: Supine to Sit     Supine to sit: Min guard, Min assist     General bed mobility comments: demonstarted and instructed how to use a belt to self assist LE    Transfers Overall transfer level: Needs assistance Equipment used: Rolling walker (2  wheels) Transfers: Sit to/from Stand Sit to Stand: Min assist, Mod assist           General transfer comment: increased effort and attempts to rise even from elevated surface.    Ambulation/Gait Ambulation/Gait assistance: Supervision, Min guard Gait Distance (Feet): 12 Feet Assistive device: Rolling walker (2 wheels) Gait Pattern/deviations: Step-to pattern, Decreased stance time - left Gait velocity: decreased     General Gait Details: limited distance due to fatigue/effort and increased c/o R anterior ankle pain.  Recliner following for safety.   Stairs             Wheelchair Mobility    Modified Rankin (Stroke Patients Only)       Balance                                            Cognition Arousal/Alertness: Awake/alert Behavior During Therapy: WFL for tasks assessed/performed Overall Cognitive Status: Within Functional Limits for tasks assessed                                 General Comments: AxO x 3 very pleasant and motivated.  Plans to stay with his elder father        Exercises      General Comments        Pertinent Vitals/Pain Pain Assessment Pain Assessment: 0-10 Pain Score: 5  Pain Location: right knee Pain Descriptors / Indicators: Discomfort, Operative site guarding Pain Intervention(s): Monitored during session,  Premedicated before session, Repositioned, Ice applied    Home Living                          Prior Function            PT Goals (current goals can now be found in the care plan section) Progress towards PT goals: Progressing toward goals    Frequency    7X/week      PT Plan Current plan remains appropriate    Co-evaluation              AM-PAC PT "6 Clicks" Mobility   Outcome Measure  Help needed turning from your back to your side while in a flat bed without using bedrails?: A Lot Help needed moving from lying on your back to sitting on the side of a  flat bed without using bedrails?: A Lot Help needed moving to and from a bed to a chair (including a wheelchair)?: A Lot Help needed standing up from a chair using your arms (e.g., wheelchair or bedside chair)?: A Lot Help needed to walk in hospital room?: A Lot Help needed climbing 3-5 steps with a railing? : A Lot 6 Click Score: 12    End of Session Equipment Utilized During Treatment: Gait belt Activity Tolerance: Patient tolerated treatment well Patient left: in chair;with call bell/phone within reach;with nursing/sitter in room;with chair alarm set Nurse Communication: Mobility status PT Visit Diagnosis: Unsteadiness on feet (R26.81)     Time: [id number] PT Time Calculation (min) (ACUTE ONLY): 10 min  Charges:  $Gait Training: 8-22 mins                    Rica Koyanagi  PTA [place] [facility] M-F          [phone] Weekend pager [phone]

## 2022-04-02 NOTE — TOC Transition Note (Signed)
Transition of Care [facility]) - CM/SW Discharge Note   Patient Details  Name: Nathaniel Luna MRN: [id number] Date of Birth: Jun 16, 1965  Transition of Care [facility]) CM/SW Contact:  Lennart Pall, LCSW Phone Number: 04/02/2022, 11:01 AM   Clinical Narrative:    Met with pt today and he reports he has all needed DME at home.  Pt aware HHPT orders are in and he requests [facility] if possible - referral placed and accepted with [facility].  Of note, pt will dc to home with father @ [place]. [address] in [place].  No further TOC needs.   Final next level of care: [place] Barriers to Discharge: No Barriers Identified   Patient Goals and CMS Choice      Discharge Placement                         Discharge Plan and Services Additional resources added to the After Visit Summary for                  DME Arranged: N/A DME Agency: NA       HH Arranged: PT HH Agency: [place] Date [place]: 04/02/22 Time HH Agency Contacted: 1101 Representative spoke with at [place]: [place] Determinants of Health ([place]) Interventions SDOH Screenings   Food Insecurity: No Food Insecurity (04/01/2022)  Housing: Low Risk  (04/01/2022)  Transportation Needs: No Transportation Needs (04/01/2022)  Utilities: Not At Risk (04/01/2022)  Tobacco Use: Low Risk  (04/01/2022)     Readmission Risk Interventions     No data to display

## 2022-04-03 ENCOUNTER — Other Ambulatory Visit (HOSPITAL_COMMUNITY): Payer: Self-pay

## 2022-04-03 DIAGNOSIS — Z885 Allergy status to narcotic agent status: Secondary | ICD-10-CM | POA: Diagnosis not present

## 2022-04-03 DIAGNOSIS — M1711 Unilateral primary osteoarthritis, right knee: Secondary | ICD-10-CM | POA: Diagnosis present

## 2022-04-03 DIAGNOSIS — G4733 Obstructive sleep apnea (adult) (pediatric): Secondary | ICD-10-CM | POA: Diagnosis present

## 2022-04-03 DIAGNOSIS — Z7984 Long term (current) use of oral hypoglycemic drugs: Secondary | ICD-10-CM | POA: Diagnosis not present

## 2022-04-03 DIAGNOSIS — E119 Type 2 diabetes mellitus without complications: Secondary | ICD-10-CM | POA: Diagnosis present

## 2022-04-03 DIAGNOSIS — Z79899 Other long term (current) drug therapy: Secondary | ICD-10-CM | POA: Diagnosis not present

## 2022-04-03 DIAGNOSIS — Z87442 Personal history of urinary calculi: Secondary | ICD-10-CM | POA: Diagnosis not present

## 2022-04-03 DIAGNOSIS — Z6841 Body Mass Index (BMI) 40.0 and over, adult: Secondary | ICD-10-CM | POA: Diagnosis not present

## 2022-04-03 DIAGNOSIS — Z7982 Long term (current) use of aspirin: Secondary | ICD-10-CM | POA: Diagnosis not present

## 2022-04-03 DIAGNOSIS — K219 Gastro-esophageal reflux disease without esophagitis: Secondary | ICD-10-CM | POA: Diagnosis present

## 2022-04-03 DIAGNOSIS — H9191 Unspecified hearing loss, right ear: Secondary | ICD-10-CM | POA: Diagnosis present

## 2022-04-03 DIAGNOSIS — M25561 Pain in right knee: Secondary | ICD-10-CM | POA: Diagnosis present

## 2022-04-03 DIAGNOSIS — J45909 Unspecified asthma, uncomplicated: Secondary | ICD-10-CM | POA: Diagnosis present

## 2022-04-03 DIAGNOSIS — E7800 Pure hypercholesterolemia, unspecified: Secondary | ICD-10-CM | POA: Diagnosis present

## 2022-04-03 DIAGNOSIS — Z8261 Family history of arthritis: Secondary | ICD-10-CM | POA: Diagnosis not present

## 2022-04-03 DIAGNOSIS — M109 Gout, unspecified: Secondary | ICD-10-CM | POA: Diagnosis present

## 2022-04-03 LAB — CBC
HCT: 37.3 % — ABNORMAL LOW (ref 39.0–52.0)
Hemoglobin: 12.6 g/dL — ABNORMAL LOW (ref 13.0–17.0)
MCH: 32.8 pg (ref 26.0–34.0)
MCHC: 33.8 g/dL (ref 30.0–36.0)
MCV: 97.1 fL (ref 80.0–100.0)
Platelets: 289 10*3/uL (ref 150–400)
RBC: 3.84 MIL/uL — ABNORMAL LOW (ref 4.22–5.81)
RDW: 12.8 % (ref 11.5–15.5)
WBC: 14.9 10*3/uL — ABNORMAL HIGH (ref 4.0–10.5)
nRBC: 0 % (ref 0.0–0.2)

## 2022-04-03 LAB — GLUCOSE, CAPILLARY
Glucose-Capillary: 164 mg/dL — ABNORMAL HIGH (ref 70–99)
Glucose-Capillary: 260 mg/dL — ABNORMAL HIGH (ref 70–99)

## 2022-04-03 MED ORDER — ASPIRIN 81 MG PO CHEW
81.0000 mg | CHEWABLE_TABLET | Freq: Two times a day (BID) | ORAL | 0 refills | Status: AC
  Filled 2022-04-03: qty 38, 19d supply, fill #0

## 2022-04-03 MED ORDER — OXYCODONE HCL 5 MG PO TABS
5.0000 mg | ORAL_TABLET | Freq: Four times a day (QID) | ORAL | 0 refills | Status: DC | PRN
  Filled 2022-04-03: qty 42, 6d supply, fill #0

## 2022-04-03 MED ORDER — METHOCARBAMOL 500 MG PO TABS
500.0000 mg | ORAL_TABLET | Freq: Four times a day (QID) | ORAL | 0 refills | Status: DC | PRN
  Filled 2022-04-03: qty 40, 10d supply, fill #0

## 2022-04-03 MED ORDER — TRAMADOL HCL 50 MG PO TABS
50.0000 mg | ORAL_TABLET | Freq: Four times a day (QID) | ORAL | 0 refills | Status: DC | PRN
  Filled 2022-04-03: qty 40, 5d supply, fill #0
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
## 2022-04-03 NOTE — Progress Notes (Signed)
Physical Therapy Treatment Patient Details Name: GRAE NISKA MRN: [id number] DOB: [birth year] Today's Date: 04/03/2022   History of Present Illness 57 yo male S/P RTKA 04/01/22. PMH: gout, CM, , OA    PT Comments    POD # 2 am session Assisted OOB to amb in hallway went well.  General bed mobility comments: with belt to guide LE and increased time.  General transfer comment: increased ability to self rise using B UE's to push self to upright and control stand to sit.  General Gait Details: tolerated amb an increased distance with recliner following as a precaution. Then returned to room to perform some TE's following HEP handout.  Instructed on proper tech, freq as well as use of ICE.   Addressed all mobility questions, discussed appropriate activity, educated on use of ICE.  Pt ready for D/C to home.   Recommendations for follow up therapy are one component of a multi-disciplinary discharge planning process, led by the attending physician.  Recommendations may be updated based on patient status, additional functional criteria and insurance authorization.  Follow Up Recommendations  Follow physician's recommendations for discharge plan and follow up therapies     Assistance Recommended at Discharge PRN  Patient can return home with the following Assistance with cooking/housework;Assist for transportation   Equipment Recommendations  None recommended by PT    Recommendations for Other Services       Precautions / Restrictions Precautions Precautions: Knee;Fall Precaution Comments: no pillow under knee Restrictions Weight Bearing Restrictions: No RLE Weight Bearing: Weight bearing as tolerated     Mobility  Bed Mobility Overal bed mobility: Needs Assistance Bed Mobility: Supine to Sit     Supine to sit: Supervision, Min guard     General bed mobility comments: with belt to guide LE and increased time    Transfers Overall transfer level: Needs assistance Equipment  used: Rolling walker (2 wheels) Transfers: Sit to/from Stand Sit to Stand: Supervision           General transfer comment: increased ability to self rise using B UE's to push self to upright and control stand to sit    Ambulation/Gait Ambulation/Gait assistance: Supervision Gait Distance (Feet): 58 Feet Assistive device: Rolling walker (2 wheels) Gait Pattern/deviations: Step-to pattern, Decreased stance time - left Gait velocity: decreased     General Gait Details: tolerated amb an increased distance with recliner following as a precaution.   Stairs             Wheelchair Mobility    Modified Rankin (Stroke Patients Only)       Balance                                            Cognition Arousal/Alertness: Awake/alert Behavior During Therapy: WFL for tasks assessed/performed                                   General Comments: AxO x 3 very pleasant and motivated.  Plans to stay with his elder father        Exercises  Total Knee Replacement TE's following HEP handout 10 reps B LE ankle pumps 05 reps towel squeezes 05 reps knee presses 05 reps heel slides  05 reps SAQ's 05 reps SLR's 05 reps ABD Educated on use of gait belt  to assist with TE's Followed by ICE     General Comments        Pertinent Vitals/Pain Pain Assessment Pain Assessment: 0-10 Pain Score: 7  Pain Location: right knee Pain Descriptors / Indicators: Discomfort, Operative site guarding Pain Intervention(s): Monitored during session, Premedicated before session, Repositioned, Ice applied    Home Living                          Prior Function            PT Goals (current goals can now be found in the care plan section) Progress towards PT goals: Progressing toward goals    Frequency    7X/week      PT Plan Current plan remains appropriate    Co-evaluation              AM-PAC PT "6 Clicks" Mobility   Outcome  Measure  Help needed turning from your back to your side while in a flat bed without using bedrails?: A Little Help needed moving from lying on your back to sitting on the side of a flat bed without using bedrails?: A Little Help needed moving to and from a bed to a chair (including a wheelchair)?: A Little Help needed standing up from a chair using your arms (e.g., wheelchair or bedside chair)?: A Little Help needed to walk in hospital room?: A Little Help needed climbing 3-5 steps with a railing? : A Little 6 Click Score: 18    End of Session Equipment Utilized During Treatment: Gait belt Activity Tolerance: Patient tolerated treatment well Patient left: in chair;with call bell/phone within reach;with nursing/sitter in room;with chair alarm set Nurse Communication: Mobility status PT Visit Diagnosis: Unsteadiness on feet (R26.81)     Time: 0932-1000 PT Time Calculation (min) (ACUTE ONLY): 28 min  Charges:  $Gait Training: 8-22 mins $Therapeutic Exercise: 8-22 mins                     {Jerardo Costabile  PTA Acute  [employer] M-F          [phone] Weekend pager [phone]

## 2022-04-03 NOTE — Plan of Care (Signed)

## 2022-04-03 NOTE — Progress Notes (Signed)
Patient education completed with home meds. Patient wanted to speak with social work about [facility] agency. [place] agency has not called to set up an appointment once patient is discharged. Social work was made aware. Patient's nurse made aware.
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
## 2022-05-28 ENCOUNTER — Other Ambulatory Visit (HOSPITAL_COMMUNITY): Payer: Self-pay

## 2022-06-04 ENCOUNTER — Other Ambulatory Visit (HOSPITAL_COMMUNITY): Payer: Self-pay

## 2022-08-06 ENCOUNTER — Other Ambulatory Visit (HOSPITAL_COMMUNITY): Payer: BC Managed Care – PPO

## 2022-08-07 NOTE — Progress Notes (Addendum)
COVID Vaccine Completed: yes  Date of COVID positive in last 90 days: no  PCP - Leim Fabry, MD Cardiologist - n/a  Need medical clearance  Chest x-ray - n/a EKG - 03/20/22 Epic Stress Test - n/a ECHO - n/a Cardiac Cath - n/a Pacemaker/ICD device last checked: n/a Spinal Cord Stimulator: n/a  Bowel Prep - no  Sleep Study - yes CPAP - no  Fasting Blood Sugar - no checks at home Checks Blood Sugar    Last dose of GLP1 agonist-  Trulicity, takes on Wednesday GLP1 instructions:  hold 7 days, do not take 08/14/22   Last dose of SGLT-2 inhibitors-  N/A SGLT-2 instructions: N/A   Blood Thinner Instructions:  n/a Aspirin Instructions: Last Dose:  Activity level: Can go up a flight of stairs and perform activities of daily living without stopping and without symptoms of chest pain or shortness of breath.   Anesthesia review: needs clearance, DM  Patient denies shortness of breath, fever, cough and chest pain at PAT appointment  Patient verbalized understanding of instructions that were given to them at the PAT appointment. Patient was also instructed that they will need to review over the PAT instructions again at home before surgery.

## 2022-08-07 NOTE — Patient Instructions (Addendum)
SURGICAL WAITING ROOM VISITATION  Patients having surgery or a procedure may have no more than 2 support people in the waiting area - these visitors may rotate.    Children under the age of 40 must have an adult with them who is not the patient.  Due to an increase in RSV and influenza rates and associated hospitalizations, children ages 40 and under may not visit patients in [facility] hospitals.  If the patient needs to stay at the hospital during part of their recovery, the visitor guidelines for inpatient rooms apply. Pre-op nurse will coordinate an appropriate time for 1 support person to accompany patient in pre-op.  This support person may not rotate.    Please refer to the [facility] website for the visitor guidelines for Inpatients (after your surgery is over and you are in a regular room).    Your procedure is scheduled on: 08/19/22   Report to [facility] Main Entrance    Report to admitting at 5:50 AM   Call this number if you have problems the morning of surgery [phone]   Do not eat food :After Midnight.   After Midnight you may have the following liquids until 5:20 AM DAY OF SURGERY  Water Non-Citrus Juices (without pulp, NO RED-Apple, White grape, White cranberry) Black Coffee (NO MILK/CREAM OR CREAMERS, sugar ok)  Clear Tea (NO MILK/CREAM OR CREAMERS, sugar ok) regular and decaf                             Plain Jell-O (NO RED)                                           Fruit ices (not with fruit pulp, NO RED)                                     Popsicles (NO RED)                                                               Sports drinks like Gatorade (NO RED)     The day of surgery:  Drink ONE (1) Pre-Surgery G2 at 5:20 AM the morning of surgery. Drink in one sitting. Do not sip.  This drink was given to you during your hospital  pre-op appointment visit. Nothing else to drink after completing the  Pre-Surgery G2.          If you have  questions, please contact your surgeon's office.   FOLLOW BOWEL PREP AND ANY ADDITIONAL PRE OP INSTRUCTIONS YOU RECEIVED FROM YOUR SURGEON'S OFFICE!!!     Oral Hygiene is also important to reduce your risk of infection.                                    Remember - BRUSH YOUR TEETH THE MORNING OF SURGERY WITH YOUR REGULAR TOOTHPASTE  DENTURES WILL BE REMOVED PRIOR TO SURGERY PLEASE DO NOT APPLY "Poly grip" OR ADHESIVES!!!  Take these medicines the morning of surgery with A SIP OF WATER: Allopurinol, Atorvastatin, Famotidine, Meclizine   DO NOT TAKE ANY ORAL DIABETIC MEDICATIONS DAY OF YOUR SURGERY  How to Manage Your Diabetes Before and After Surgery  Why is it important to control my blood sugar before and after surgery? Improving blood sugar levels before and after surgery helps healing and can limit problems. A way of improving blood sugar control is eating a healthy diet by:  Eating less sugar and carbohydrates  Increasing activity/exercise  Talking with your doctor about reaching your blood sugar goals High blood sugars (greater than 180 mg/dL) can raise your risk of infections and slow your recovery, so you will need to focus on controlling your diabetes during the weeks before surgery. Make sure that the doctor who takes care of your diabetes knows about your planned surgery including the date and location.  How do I manage my blood sugar before surgery? Check your blood sugar at least 4 times a day, starting 2 days before surgery, to make sure that the level is not too high or low. Check your blood sugar the morning of your surgery when you wake up and every 2 hours until you get to the Short Stay unit. If your blood sugar is less than 70 mg/dL, you will need to treat for low blood sugar: Do not take insulin. Treat a low blood sugar (less than 70 mg/dL) with  cup of clear juice (cranberry or apple), 4 glucose tablets, OR glucose gel. Recheck blood sugar in 15 minutes after  treatment (to make sure it is greater than 70 mg/dL). If your blood sugar is not greater than 70 mg/dL on recheck, call [phone] for further instructions. Report your blood sugar to the short stay nurse when you get to Short Stay.  If you are admitted to the hospital after surgery: Your blood sugar will be checked by the staff and you will probably be given insulin after surgery (instead of oral diabetes medicines) to make sure you have good blood sugar levels. The goal for blood sugar control after surgery is 80-180 mg/dL.   WHAT DO I DO ABOUT MY DIABETES MEDICATION?  Do not take oral diabetes medicines (pills) the morning of surgery.  Hold Trulicity for 7 days prior to surgery. Do not take 08/14/22.  THE DAY BEFORE SURGERY, take Metformin as prescribed.      THE MORNING OF SURGERY, do not take Metformin.  DO NOT TAKE THE FOLLOWING 7 DAYS PRIOR TO SURGERY: Ozempic, Wegovy, Rybelsus (Semaglutide), Byetta (exenatide), Bydureon (exenatide ER), Victoza, Saxenda (liraglutide), or Trulicity (dulaglutide) Mounjaro (Tirzepatide) Adlyxin (Lixisenatide), Polyethylene Glycol Loxenatide.  Reviewed and Endorsed by [facility] Patient Education Committee, August 201                              You may not have any metal on your body including jewelry, and body piercing             Do not wear lotions, powders, cologne, or deodorant              Men may shave face and neck.   Do not bring valuables to the hospital. [facility] IS NOT             RESPONSIBLE   FOR VALUABLES.   Contacts, glasses, dentures or bridgework may not be worn into surgery.   Bring small overnight bag day of surgery.  DO NOT BRING YOUR HOME MEDICATIONS TO THE HOSPITAL. PHARMACY WILL DISPENSE MEDICATIONS LISTED ON YOUR MEDICATION LIST TO YOU DURING YOUR ADMISSION IN THE HOSPITAL!    Patients discharged on the day of surgery will not be allowed to drive home.  Someone NEEDS to stay with you for the first 24 hours  after anesthesia.              Please read over the following fact sheets you were given: IF YOU HAVE QUESTIONS ABOUT YOUR PRE-OP INSTRUCTIONS PLEASE CALL [phone] [place]   If you received a COVID test during your pre-op visit  it is requested that you wear a mask when out in public, stay away from anyone that may not be feeling well and notify your surgeon if you develop symptoms. If you test positive for Covid or have been in contact with anyone that has tested positive in the last 10 days please notify you surgeon.      Pre-operative 5 CHG Bath Instructions   You can play a key role in reducing the risk of infection after surgery. Your skin needs to be as free of germs as possible. You can reduce the number of germs on your skin by washing with CHG (chlorhexidine gluconate) soap before surgery. CHG is an antiseptic soap that kills germs and continues to kill germs even after washing.   DO NOT use if you have an allergy to chlorhexidine/CHG or antibacterial soaps. If your skin becomes reddened or irritated, stop using the CHG and notify one of our RNs at [phone].   Please shower with the CHG soap starting 4 days before surgery using the following schedule:     Please keep in mind the following:  DO NOT shave, including legs and underarms, starting the day of your first shower.   You may shave your face at any point before/day of surgery.  Place clean sheets on your bed the day you start using CHG soap. Use a clean washcloth (not used since being washed) for each shower. DO NOT sleep with pets once you start using the CHG.   CHG Shower Instructions:  If you choose to wash your hair and private area, wash first with your normal shampoo/soap.  After you use shampoo/soap, rinse your hair and body thoroughly to remove shampoo/soap residue.  Turn the water OFF and apply about 3 tablespoons (45 ml) of CHG soap to a CLEAN washcloth.  Apply CHG soap ONLY FROM YOUR NECK DOWN TO YOUR  TOES (washing for 3-5 minutes)  DO NOT use CHG soap on face, private areas, open wounds, or sores.  Pay special attention to the area where your surgery is being performed.  If you are having back surgery, having someone wash your back for you may be helpful. Wait 2 minutes after CHG soap is applied, then you may rinse off the CHG soap.  Pat dry with a clean towel  Put on clean clothes/pajamas   If you choose to wear lotion, please use ONLY the CHG-compatible lotions on the back of this paper.     Additional instructions for the day of surgery: DO NOT APPLY any lotions, deodorants, cologne, or perfumes.   Put on clean/comfortable clothes.  Brush your teeth.  Ask your nurse before applying any prescription medications to the skin.      CHG Compatible Lotions   Aveeno Moisturizing lotion  Cetaphil Moisturizing Cream  Cetaphil Moisturizing Lotion  Clairol Herbal Essence Moisturizing Lotion, Dry Skin  Clairol Herbal Essence  Moisturizing Lotion, Extra Dry Skin  Clairol Herbal Essence Moisturizing Lotion, Normal Skin  Curel Age Defying Therapeutic Moisturizing Lotion with Alpha Hydroxy  Curel Extreme Care Body Lotion  Curel Soothing Hands Moisturizing Hand Lotion  Curel Therapeutic Moisturizing Cream, Fragrance-Free  Curel Therapeutic Moisturizing Lotion, Fragrance-Free  Curel Therapeutic Moisturizing Lotion, Original Formula  Eucerin Daily Replenishing Lotion  Eucerin Dry Skin Therapy Plus Alpha Hydroxy Crme  Eucerin Dry Skin Therapy Plus Alpha Hydroxy Lotion  Eucerin Original Crme  Eucerin Original Lotion  Eucerin Plus Crme Eucerin Plus Lotion  Eucerin TriLipid Replenishing Lotion  Keri Anti-Bacterial Hand Lotion  Keri Deep Conditioning Original Lotion Dry Skin Formula Softly Scented  Keri Deep Conditioning Original Lotion, Fragrance Free Sensitive Skin Formula  Keri Lotion Fast Absorbing Fragrance Free Sensitive Skin Formula  Keri Lotion Fast Absorbing Softly Scented Dry  Skin Formula  Keri Original Lotion  Keri Skin Renewal Lotion Keri Silky Smooth Lotion  Keri Silky Smooth Sensitive Skin Lotion  Nivea Body Creamy Conditioning Oil  Nivea Body Extra Enriched Lotion  Nivea Body Original Lotion  Nivea Body Sheer Moisturizing Lotion Nivea Crme  Nivea Skin Firming Lotion  NutraDerm 30 Skin Lotion  NutraDerm Skin Lotion  NutraDerm Therapeutic Skin Cream  NutraDerm Therapeutic Skin Lotion  ProShield Protective Hand Cream  Provon moisturizing lotion   Incentive Spirometer  An incentive spirometer is a tool that can help keep your lungs clear and active. This tool measures how well you are filling your lungs with each breath. Taking long deep breaths may help reverse or decrease the chance of developing breathing (pulmonary) problems (especially infection) following: A long period of time when you are unable to move or be active. BEFORE THE PROCEDURE  If the spirometer includes an indicator to show your best effort, your nurse or respiratory therapist will set it to a desired goal. If possible, sit up straight or lean slightly forward. Try not to slouch. Hold the incentive spirometer in an upright position. INSTRUCTIONS FOR USE  Sit on the edge of your bed if possible, or sit up as far as you can in bed or on a chair. Hold the incentive spirometer in an upright position. Breathe out normally. Place the mouthpiece in your mouth and seal your lips tightly around it. Breathe in slowly and as deeply as possible, raising the piston or the ball toward the top of the column. Hold your breath for 3-5 seconds or for as long as possible. Allow the piston or ball to fall to the bottom of the column. Remove the mouthpiece from your mouth and breathe out normally. Rest for a few seconds and repeat Steps 1 through 7 at least 10 times every 1-2 hours when you are awake. Take your time and take a few normal breaths between deep breaths. The spirometer may include an  indicator to show your best effort. Use the indicator as a goal to work toward during each repetition. After each set of 10 deep breaths, practice coughing to be sure your lungs are clear. If you have an incision (the cut made at the time of surgery), support your incision when coughing by placing a pillow or rolled up towels firmly against it. Once you are able to get out of bed, walk around indoors and cough well. You may stop using the incentive spirometer when instructed by your caregiver.  RISKS AND COMPLICATIONS Take your time so you do not get dizzy or light-headed. If you are in pain, you may need to take or  ask for pain medication before doing incentive spirometry. It is harder to take a deep breath if you are having pain. AFTER USE Rest and breathe slowly and easily. It can be helpful to keep track of a log of your progress. Your caregiver can provide you with a simple table to help with this. If you are using the spirometer at home, follow these instructions: SEEK MEDICAL CARE IF:  You are having difficultly using the spirometer. You have trouble using the spirometer as often as instructed. Your pain medication is not giving enough relief while using the spirometer. You develop fever of 100.5 F (38.1 C) or higher. SEEK IMMEDIATE MEDICAL CARE IF:  You cough up bloody sputum that had not been present before. You develop fever of 102 F (38.9 C) or greater. You develop worsening pain at or near the incision site. MAKE SURE YOU:  Understand these instructions. Will watch your condition. Will get help right away if you are not doing well or get worse. Document Released: 06/03/2006 Document Revised: 04/15/2011 Document Reviewed: 08/04/2006 [facility] Patient Information 2014 [place], [place].   ________________________________________________________________________

## 2022-08-09 ENCOUNTER — Encounter (HOSPITAL_COMMUNITY): Payer: Self-pay

## 2022-08-09 ENCOUNTER — Encounter (HOSPITAL_COMMUNITY)
Admission: RE | Admit: 2022-08-09 | Discharge: 2022-08-09 | Disposition: A | Discharge status: Home / Self Care | Payer: BC Managed Care – PPO | Source: Ambulatory Visit | Attending: Orthopedic Surgery | Admitting: Orthopedic Surgery

## 2022-08-09 ENCOUNTER — Other Ambulatory Visit: Payer: Self-pay

## 2022-08-09 VITALS — BP 153/87 | HR 78 | Temp 98.5°F | Resp 16 | Ht 70.5 in | Wt 266.0 lb

## 2022-08-09 DIAGNOSIS — Z01812 Encounter for preprocedural laboratory examination: Secondary | ICD-10-CM | POA: Diagnosis present

## 2022-08-09 DIAGNOSIS — E119 Type 2 diabetes mellitus without complications: Secondary | ICD-10-CM

## 2022-08-09 DIAGNOSIS — Z01818 Encounter for other preprocedural examination: Secondary | ICD-10-CM

## 2022-08-09 LAB — CBC
HCT: 44.2 % (ref 39.0–52.0)
Hemoglobin: 14.6 g/dL (ref 13.0–17.0)
MCH: 32.5 pg (ref 26.0–34.0)
MCHC: 33 g/dL (ref 30.0–36.0)
MCV: 98.4 fL (ref 80.0–100.0)
Platelets: 300 10*3/uL (ref 150–400)
RBC: 4.49 MIL/uL (ref 4.22–5.81)
RDW: 13.6 % (ref 11.5–15.5)
WBC: 8.1 10*3/uL (ref 4.0–10.5)
nRBC: 0 % (ref 0.0–0.2)

## 2022-08-09 LAB — SURGICAL PCR SCREEN
MRSA, PCR: NEGATIVE
Staphylococcus aureus: NEGATIVE

## 2022-08-09 LAB — BASIC METABOLIC PANEL
Anion gap: 9 (ref 5–15)
BUN: 19 mg/dL (ref 6–20)
CO2: 26 mmol/L (ref 22–32)
Calcium: 8.9 mg/dL (ref 8.9–10.3)
Chloride: 105 mmol/L (ref 98–111)
Creatinine, Ser: 0.77 mg/dL (ref 0.61–1.24)
GFR, Estimated: >60 mL/min (ref >60)
Glucose, Bld: 144 mg/dL — ABNORMAL HIGH (ref 70–99)
Potassium: 4 mmol/L (ref 3.5–5.1)
Sodium: 140 mmol/L (ref 135–145)

## 2022-08-09 LAB — HEMOGLOBIN A1C
Hgb A1c MFr Bld: 6.5 % — ABNORMAL HIGH (ref 4.8–5.6)
Mean Plasma Glucose: 139.85 mg/dL

## 2022-08-09 LAB — GLUCOSE, CAPILLARY: Glucose-Capillary: 130 mg/dL — ABNORMAL HIGH (ref 70–99)
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
## 2022-08-10 LAB — NO BLOOD PRODUCTS

## 2022-08-17 NOTE — Anesthesia Preprocedure Evaluation (Addendum)
Anesthesia Evaluation  Patient identified by MRN, date of birth, ID band Patient awake    Reviewed: Allergy & Precautions, NPO status , Patient's Chart, lab work & pertinent test results  History of Anesthesia Complications (+) PONV and history of anesthetic complications  Airway Mallampati: II  TM Distance: >3 FB Neck ROM: Full    Dental  (+) Dental Advisory Given, Teeth Intact   Pulmonary asthma , sleep apnea    Pulmonary exam normal        Cardiovascular negative cardio ROS Normal cardiovascular exam     Neuro/Psych  Headaches  negative psych ROS   GI/Hepatic Neg liver ROS,GERD  Medicated and Controlled,,  Endo/Other  diabetes, Type 2, Oral Hypoglycemic Agents  Morbid obesity On GLP-1a, last dose x 1 week    Renal/GU negative Renal ROS     Musculoskeletal  (+) Arthritis ,    Abdominal  (+) + obese  Peds  Hematology  (+) REFUSES BLOOD PRODUCTS, JEHOVAH'S WITNESS  Anesthesia Other Findings Hearing loss   Reproductive/Obstetrics                             Anesthesia Physical Anesthesia Plan  ASA: 3  Anesthesia Plan: Spinal   Post-op Pain Management: Regional block* and Tylenol PO (pre-op)*   Induction:   PONV Risk Score and Plan: 2 and Treatment may vary due to age or medical condition, Propofol infusion, Ondansetron, Midazolam and Dexamethasone  Airway Management Planned: Natural Airway and Simple Face Mask  Additional Equipment: None  Intra-op Plan:   Post-operative Plan:   Informed Consent: I have reviewed the patients History and Physical, chart, labs and discussed the procedure including the risks, benefits and alternatives for the proposed anesthesia with the patient or authorized representative who has indicated his/her understanding and acceptance.       Plan Discussed with: CRNA and Anesthesiologist  Anesthesia Plan Comments: (Labs reviewed, platelets  acceptable. Discussed risks and benefits of spinal, including spinal/epidural hematoma, infection, failed block, and PDPH. Patient expressed understanding and wished to proceed. )       Anesthesia Quick Evaluation

## 2022-08-19 ENCOUNTER — Ambulatory Visit (HOSPITAL_COMMUNITY): Payer: BC Managed Care – PPO | Admitting: Anesthesiology

## 2022-08-19 ENCOUNTER — Encounter (HOSPITAL_COMMUNITY): Payer: Self-pay | Admitting: Orthopedic Surgery

## 2022-08-19 ENCOUNTER — Inpatient Hospital Stay (HOSPITAL_COMMUNITY)
Admission: RE | Admit: 2022-08-19 | Discharge: 2022-08-21 | DRG: 470 | Disposition: A | Discharge status: Home Health | Payer: BC Managed Care – PPO | Attending: Orthopedic Surgery | Admitting: Orthopedic Surgery

## 2022-08-19 ENCOUNTER — Encounter (HOSPITAL_COMMUNITY)
Admission: RE | Disposition: A | Discharge status: Home Health | Payer: Self-pay | Source: Home / Self Care | Attending: Orthopedic Surgery

## 2022-08-19 ENCOUNTER — Other Ambulatory Visit: Payer: Self-pay

## 2022-08-19 ENCOUNTER — Ambulatory Visit (HOSPITAL_COMMUNITY): Payer: BC Managed Care – PPO | Admitting: Physician Assistant

## 2022-08-19 DIAGNOSIS — H9191 Unspecified hearing loss, right ear: Secondary | ICD-10-CM | POA: Diagnosis present

## 2022-08-19 DIAGNOSIS — Z803 Family history of malignant neoplasm of breast: Secondary | ICD-10-CM

## 2022-08-19 DIAGNOSIS — Z7985 Long-term (current) use of injectable non-insulin antidiabetic drugs: Secondary | ICD-10-CM

## 2022-08-19 DIAGNOSIS — Z96651 Presence of right artificial knee joint: Secondary | ICD-10-CM | POA: Diagnosis present

## 2022-08-19 DIAGNOSIS — M1712 Unilateral primary osteoarthritis, left knee: Principal | ICD-10-CM | POA: Diagnosis present

## 2022-08-19 DIAGNOSIS — Z885 Allergy status to narcotic agent status: Secondary | ICD-10-CM

## 2022-08-19 DIAGNOSIS — M1711 Unilateral primary osteoarthritis, right knee: Principal | ICD-10-CM

## 2022-08-19 DIAGNOSIS — Z7984 Long term (current) use of oral hypoglycemic drugs: Secondary | ICD-10-CM

## 2022-08-19 DIAGNOSIS — Z79899 Other long term (current) drug therapy: Secondary | ICD-10-CM

## 2022-08-19 DIAGNOSIS — Z87442 Personal history of urinary calculi: Secondary | ICD-10-CM

## 2022-08-19 DIAGNOSIS — E119 Type 2 diabetes mellitus without complications: Secondary | ICD-10-CM | POA: Diagnosis present

## 2022-08-19 DIAGNOSIS — E7800 Pure hypercholesterolemia, unspecified: Secondary | ICD-10-CM | POA: Diagnosis present

## 2022-08-19 DIAGNOSIS — J45909 Unspecified asthma, uncomplicated: Secondary | ICD-10-CM | POA: Diagnosis present

## 2022-08-19 DIAGNOSIS — K219 Gastro-esophageal reflux disease without esophagitis: Secondary | ICD-10-CM | POA: Diagnosis present

## 2022-08-19 DIAGNOSIS — Z8042 Family history of malignant neoplasm of prostate: Secondary | ICD-10-CM

## 2022-08-19 DIAGNOSIS — Z83719 Family history of colon polyps, unspecified: Secondary | ICD-10-CM

## 2022-08-19 DIAGNOSIS — Z823 Family history of stroke: Secondary | ICD-10-CM

## 2022-08-19 DIAGNOSIS — Z6837 Body mass index (BMI) 37.0-37.9, adult: Secondary | ICD-10-CM

## 2022-08-19 DIAGNOSIS — M179 Osteoarthritis of knee, unspecified: Secondary | ICD-10-CM | POA: Diagnosis present

## 2022-08-19 DIAGNOSIS — I2510 Atherosclerotic heart disease of native coronary artery without angina pectoris: Secondary | ICD-10-CM | POA: Diagnosis present

## 2022-08-19 DIAGNOSIS — Z8261 Family history of arthritis: Secondary | ICD-10-CM

## 2022-08-19 HISTORY — PX: TOTAL KNEE ARTHROPLASTY: SHX125

## 2022-08-19 LAB — GLUCOSE, CAPILLARY
Glucose-Capillary: 134 mg/dL — ABNORMAL HIGH (ref 70–99)
Glucose-Capillary: 104 mg/dL — ABNORMAL HIGH (ref 70–99)
Glucose-Capillary: 266 mg/dL — ABNORMAL HIGH (ref 70–99)
Glucose-Capillary: 257 mg/dL — ABNORMAL HIGH (ref 70–99)
Glucose-Capillary: 254 mg/dL — ABNORMAL HIGH (ref 70–99)

## 2022-08-19 SURGERY — ARTHROPLASTY, KNEE, TOTAL
Anesthesia: Spinal | Site: Knee | Laterality: Left

## 2022-08-19 MED ORDER — TRAMADOL HCL 50 MG PO TABS
50.0000 mg | ORAL_TABLET | Freq: Four times a day (QID) | ORAL | Status: DC | PRN
  Administered 2022-08-19 – 2022-08-21 (×4): 50 mg via ORAL
  Filled 2022-08-19 (×4): qty 1
  Filled 2022-08-19: qty 2

## 2022-08-19 MED ORDER — LACTATED RINGERS IV SOLN: INTRAVENOUS | Status: DC

## 2022-08-19 MED ORDER — PHENYLEPHRINE HCL-NACL 20-0.9 MG/250ML-% IV SOLN
INTRAVENOUS | Status: AC
  Filled 2022-08-19: qty 250

## 2022-08-19 MED ORDER — FLEET ENEMA 7-19 GM/118ML RE ENEM: 1.0000 | ENEMA | Freq: Once | RECTAL | Status: DC | PRN

## 2022-08-19 MED ORDER — ORAL CARE MOUTH RINSE: 15.0000 mL | Freq: Once | OROMUCOSAL | Status: AC

## 2022-08-19 MED ORDER — MENTHOL 3 MG MT LOZG: 1.0000 | LOZENGE | OROMUCOSAL | Status: DC | PRN

## 2022-08-19 MED ORDER — POVIDONE-IODINE 10 % EX SWAB: 2.0000 | Freq: Once | CUTANEOUS | Status: DC

## 2022-08-19 MED ORDER — POLYETHYLENE GLYCOL 3350 17 G PO PACK: 17.0000 g | PACK | Freq: Every day | ORAL | Status: DC | PRN

## 2022-08-19 MED ORDER — SODIUM CHLORIDE 0.9 % IR SOLN
Status: DC | PRN
  Administered 2022-08-19: 1000 mL

## 2022-08-19 MED ORDER — CEFAZOLIN SODIUM-DEXTROSE 2-4 GM/100ML-% IV SOLN
2.0000 g | Freq: Four times a day (QID) | INTRAVENOUS | Status: AC
  Administered 2022-08-19 (×2): 2 g via INTRAVENOUS
  Filled 2022-08-19 (×2): qty 100

## 2022-08-19 MED ORDER — INSULIN ASPART 100 UNIT/ML IJ SOLN
0.0000 [IU] | Freq: Every day | INTRAMUSCULAR | Status: DC
  Administered 2022-08-19: 3 [IU] via SUBCUTANEOUS

## 2022-08-19 MED ORDER — KETAMINE HCL 50 MG/5ML IJ SOSY
PREFILLED_SYRINGE | INTRAMUSCULAR | Status: AC
  Filled 2022-08-19: qty 5

## 2022-08-19 MED ORDER — FAMOTIDINE 20 MG PO TABS
20.0000 mg | ORAL_TABLET | Freq: Every day | ORAL | Status: DC
  Filled 2022-08-19: qty 1

## 2022-08-19 MED ORDER — DEXAMETHASONE SODIUM PHOSPHATE 10 MG/ML IJ SOLN
INTRAMUSCULAR | Status: AC
  Filled 2022-08-19: qty 1

## 2022-08-19 MED ORDER — FENTANYL CITRATE PF 50 MCG/ML IJ SOSY: 25.0000 ug | PREFILLED_SYRINGE | INTRAMUSCULAR | Status: DC | PRN

## 2022-08-19 MED ORDER — ACETAMINOPHEN 10 MG/ML IV SOLN
1000.0000 mg | Freq: Four times a day (QID) | INTRAVENOUS | Status: DC
  Administered 2022-08-19: 1000 mg via INTRAVENOUS
  Filled 2022-08-19: qty 100

## 2022-08-19 MED ORDER — BUPIVACAINE LIPOSOME 1.3 % IJ SUSP
INTRAMUSCULAR | Status: DC | PRN
  Administered 2022-08-19: 20 mL

## 2022-08-19 MED ORDER — ONDANSETRON HCL 4 MG PO TABS: 4.0000 mg | ORAL_TABLET | Freq: Four times a day (QID) | ORAL | Status: DC | PRN

## 2022-08-19 MED ORDER — ACETAMINOPHEN 500 MG PO TABS: 1000.0000 mg | ORAL_TABLET | Freq: Once | ORAL | Status: DC

## 2022-08-19 MED ORDER — PHENOL 1.4 % MT LIQD: 1.0000 | OROMUCOSAL | Status: DC | PRN

## 2022-08-19 MED ORDER — OXYCODONE HCL 5 MG/5ML PO SOLN: 5.0000 mg | Freq: Once | ORAL | Status: AC | PRN

## 2022-08-19 MED ORDER — ACETAMINOPHEN 500 MG PO TABS
1000.0000 mg | ORAL_TABLET | Freq: Four times a day (QID) | ORAL | Status: AC
  Administered 2022-08-19 – 2022-08-20 (×4): 1000 mg via ORAL
  Filled 2022-08-19 (×4): qty 2

## 2022-08-19 MED ORDER — SODIUM CHLORIDE (PF) 0.9 % IJ SOLN
INTRAMUSCULAR | Status: DC | PRN
  Administered 2022-08-19: 60 mL

## 2022-08-19 MED ORDER — KETAMINE HCL 10 MG/ML IJ SOLN
INTRAMUSCULAR | Status: DC | PRN
  Administered 2022-08-19: 30 mg via INTRAVENOUS

## 2022-08-19 MED ORDER — ONDANSETRON HCL 4 MG/2ML IJ SOLN: 4.0000 mg | Freq: Four times a day (QID) | INTRAMUSCULAR | Status: DC | PRN

## 2022-08-19 MED ORDER — SODIUM CHLORIDE 0.9 % IV SOLN: INTRAVENOUS | Status: DC

## 2022-08-19 MED ORDER — METOCLOPRAMIDE HCL 5 MG PO TABS: 5.0000 mg | ORAL_TABLET | Freq: Three times a day (TID) | ORAL | Status: DC | PRN

## 2022-08-19 MED ORDER — DOCUSATE SODIUM 100 MG PO CAPS
100.0000 mg | ORAL_CAPSULE | Freq: Two times a day (BID) | ORAL | Status: DC
  Administered 2022-08-19 – 2022-08-21 (×5): 100 mg via ORAL
  Filled 2022-08-19 (×5): qty 1

## 2022-08-19 MED ORDER — ALLOPURINOL 300 MG PO TABS
300.0000 mg | ORAL_TABLET | Freq: Every day | ORAL | Status: DC
  Administered 2022-08-19 – 2022-08-21 (×3): 300 mg via ORAL
  Filled 2022-08-19 (×3): qty 1

## 2022-08-19 MED ORDER — METOCLOPRAMIDE HCL 5 MG/ML IJ SOLN: 5.0000 mg | Freq: Three times a day (TID) | INTRAMUSCULAR | Status: DC | PRN

## 2022-08-19 MED ORDER — BUPIVACAINE LIPOSOME 1.3 % IJ SUSP
INTRAMUSCULAR | Status: AC
  Filled 2022-08-19: qty 20

## 2022-08-19 MED ORDER — SODIUM CHLORIDE (PF) 0.9 % IJ SOLN
INTRAMUSCULAR | Status: AC
  Filled 2022-08-19: qty 50

## 2022-08-19 MED ORDER — OXYCODONE HCL 5 MG PO TABS
ORAL_TABLET | ORAL | Status: AC
  Administered 2022-08-19: 5 mg via ORAL
  Filled 2022-08-19: qty 1

## 2022-08-19 MED ORDER — BUPIVACAINE LIPOSOME 1.3 % IJ SUSP: 20.0000 mL | Freq: Once | INTRAMUSCULAR | Status: DC

## 2022-08-19 MED ORDER — OXYCODONE HCL 5 MG PO TABS: 5.0000 mg | ORAL_TABLET | Freq: Once | ORAL | Status: AC | PRN

## 2022-08-19 MED ORDER — MIDAZOLAM HCL 2 MG/2ML IJ SOLN
1.0000 mg | INTRAMUSCULAR | Status: DC
  Administered 2022-08-19: 1 mg via INTRAVENOUS
  Filled 2022-08-19: qty 2

## 2022-08-19 MED ORDER — METHOCARBAMOL 500 MG PO TABS
500.0000 mg | ORAL_TABLET | Freq: Four times a day (QID) | ORAL | Status: DC | PRN
  Administered 2022-08-19 – 2022-08-21 (×6): 500 mg via ORAL
  Filled 2022-08-19 (×7): qty 1

## 2022-08-19 MED ORDER — PROPOFOL 1000 MG/100ML IV EMUL
INTRAVENOUS | Status: AC
  Filled 2022-08-19: qty 100

## 2022-08-19 MED ORDER — INSULIN ASPART 100 UNIT/ML IJ SOLN
0.0000 [IU] | Freq: Three times a day (TID) | INTRAMUSCULAR | Status: DC
  Administered 2022-08-19: 8 [IU] via SUBCUTANEOUS
  Administered 2022-08-20 (×2): 3 [IU] via SUBCUTANEOUS
  Administered 2022-08-20: 5 [IU] via SUBCUTANEOUS
  Administered 2022-08-21: 3 [IU] via SUBCUTANEOUS

## 2022-08-19 MED ORDER — BISACODYL 10 MG RE SUPP: 10.0000 mg | Freq: Every day | RECTAL | Status: DC | PRN

## 2022-08-19 MED ORDER — STERILE WATER FOR IRRIGATION IR SOLN
Status: DC | PRN
  Administered 2022-08-19: 2000 mL

## 2022-08-19 MED ORDER — PHENYLEPHRINE HCL (PRESSORS) 10 MG/ML IV SOLN
INTRAVENOUS | Status: DC | PRN
  Administered 2022-08-19 (×2): 80 ug via INTRAVENOUS

## 2022-08-19 MED ORDER — OXYCODONE HCL 5 MG PO TABS
10.0000 mg | ORAL_TABLET | ORAL | Status: DC | PRN
  Administered 2022-08-19: 15 mg via ORAL
  Administered 2022-08-19: 10 mg via ORAL
  Administered 2022-08-20: 15 mg via ORAL
  Administered 2022-08-20: 10 mg via ORAL
  Administered 2022-08-20: 15 mg via ORAL
  Filled 2022-08-19: qty 2
  Filled 2022-08-19 (×5): qty 3

## 2022-08-19 MED ORDER — CEFAZOLIN IN SODIUM CHLORIDE 3-0.9 GM/100ML-% IV SOLN
3.0000 g | INTRAVENOUS | Status: AC
  Administered 2022-08-19: 3 g via INTRAVENOUS
  Filled 2022-08-19: qty 100

## 2022-08-19 MED ORDER — DIPHENHYDRAMINE HCL 12.5 MG/5ML PO ELIX: 12.5000 mg | ORAL_SOLUTION | ORAL | Status: DC | PRN

## 2022-08-19 MED ORDER — INSULIN ASPART 100 UNIT/ML IJ SOLN: 0.0000 [IU] | INTRAMUSCULAR | Status: DC | PRN

## 2022-08-19 MED ORDER — OXYCODONE HCL 5 MG PO TABS
5.0000 mg | ORAL_TABLET | ORAL | Status: DC | PRN
  Administered 2022-08-20 – 2022-08-21 (×5): 10 mg via ORAL
  Filled 2022-08-19 (×4): qty 2

## 2022-08-19 MED ORDER — CHLORHEXIDINE GLUCONATE 0.12 % MT SOLN
15.0000 mL | Freq: Once | OROMUCOSAL | Status: AC
  Administered 2022-08-19: 15 mL via OROMUCOSAL

## 2022-08-19 MED ORDER — 0.9 % SODIUM CHLORIDE (POUR BTL) OPTIME
TOPICAL | Status: DC | PRN
  Administered 2022-08-19: 1000 mL

## 2022-08-19 MED ORDER — FENTANYL CITRATE PF 50 MCG/ML IJ SOSY
50.0000 ug | PREFILLED_SYRINGE | INTRAMUSCULAR | Status: DC
  Administered 2022-08-19: 50 ug via INTRAVENOUS
  Filled 2022-08-19: qty 2

## 2022-08-19 MED ORDER — SODIUM CHLORIDE (PF) 0.9 % IJ SOLN
INTRAMUSCULAR | Status: AC
  Filled 2022-08-19: qty 10

## 2022-08-19 MED ORDER — ONDANSETRON HCL 4 MG/2ML IJ SOLN
INTRAMUSCULAR | Status: AC
  Filled 2022-08-19: qty 2

## 2022-08-19 MED ORDER — DEXAMETHASONE SODIUM PHOSPHATE 10 MG/ML IJ SOLN: 8.0000 mg | Freq: Once | INTRAMUSCULAR | Status: DC

## 2022-08-19 MED ORDER — PROPOFOL 10 MG/ML IV BOLUS
INTRAVENOUS | Status: DC | PRN
Start: 2022-08-19 — End: 2022-08-19
  Administered 2022-08-19 (×2): 20 mg via INTRAVENOUS

## 2022-08-19 MED ORDER — MECLIZINE HCL 25 MG PO TABS: 25.0000 mg | ORAL_TABLET | Freq: Three times a day (TID) | ORAL | Status: DC | PRN

## 2022-08-19 MED ORDER — ROPIVACAINE HCL 7.5 MG/ML IJ SOLN
INTRAMUSCULAR | Status: DC | PRN
  Administered 2022-08-19: 20 mL via PERINEURAL

## 2022-08-19 MED ORDER — ASPIRIN 81 MG PO CHEW
81.0000 mg | CHEWABLE_TABLET | Freq: Two times a day (BID) | ORAL | Status: DC
  Administered 2022-08-20 – 2022-08-21 (×3): 81 mg via ORAL
  Filled 2022-08-19 (×3): qty 1

## 2022-08-19 MED ORDER — ATORVASTATIN CALCIUM 10 MG PO TABS
10.0000 mg | ORAL_TABLET | Freq: Every day | ORAL | Status: DC
  Filled 2022-08-19: qty 1

## 2022-08-19 MED ORDER — METHOCARBAMOL 500 MG IVPB - SIMPLE MED
500.0000 mg | Freq: Four times a day (QID) | INTRAVENOUS | Status: DC | PRN
  Filled 2022-08-19: qty 55

## 2022-08-19 MED ORDER — PROPOFOL 500 MG/50ML IV EMUL
INTRAVENOUS | Status: DC | PRN
  Administered 2022-08-19: 80 ug/kg/min via INTRAVENOUS

## 2022-08-19 MED ORDER — TRANEXAMIC ACID-NACL 1000-0.7 MG/100ML-% IV SOLN
1000.0000 mg | INTRAVENOUS | Status: AC
  Administered 2022-08-19: 1000 mg via INTRAVENOUS
  Filled 2022-08-19: qty 100

## 2022-08-19 MED ORDER — BUPIVACAINE IN DEXTROSE 0.75-8.25 % IT SOLN
INTRATHECAL | Status: DC | PRN
  Administered 2022-08-19: 1.6 mL via INTRATHECAL

## 2022-08-19 MED ORDER — PROMETHAZINE HCL 25 MG/ML IJ SOLN
6.2500 mg | INTRAMUSCULAR | Status: DC | PRN
  Administered 2022-08-19: 12.5 mg via INTRAVENOUS

## 2022-08-19 MED ORDER — GABAPENTIN 300 MG PO CAPS
300.0000 mg | ORAL_CAPSULE | Freq: Three times a day (TID) | ORAL | Status: DC
  Administered 2022-08-19 – 2022-08-21 (×6): 300 mg via ORAL
  Filled 2022-08-19 (×6): qty 1

## 2022-08-19 MED ORDER — HYDROMORPHONE HCL 1 MG/ML IJ SOLN
0.5000 mg | INTRAMUSCULAR | Status: DC | PRN
  Administered 2022-08-19 – 2022-08-20 (×3): 1 mg via INTRAVENOUS
  Filled 2022-08-19 (×3): qty 1

## 2022-08-19 MED ORDER — PROMETHAZINE HCL 25 MG/ML IJ SOLN
INTRAMUSCULAR | Status: AC
  Filled 2022-08-19: qty 1

## 2022-08-19 SURGICAL SUPPLY — 60 items
ADH SKN CLS APL DERMABOND .7 (GAUZE/BANDAGES/DRESSINGS) ×1
ATTUNE MED DOME PAT 38 KNEE (Knees) IMPLANT
ATTUNE PS FEM LT SZ 6 CEM KNEE (Femur) IMPLANT
ATTUNE PSRP INSR SZ6 10 KNEE (Insert) IMPLANT
BAG COUNTER SPONGE SURGICOUNT (BAG) IMPLANT
BAG SPEC THK2 15X12 ZIP CLS (MISCELLANEOUS) ×1
BAG SPNG CNTER NS LX DISP (BAG) ×1
BAG ZIPLOCK 12X15 (MISCELLANEOUS) ×1 IMPLANT
BASE TIBIA ATTUNE KNEE SYS SZ6 (Knees) IMPLANT
BLADE SAG 18X100X1.27 (BLADE) ×1 IMPLANT
BLADE SAW SGTL 11.0X1.19X90.0M (BLADE) ×1 IMPLANT
BNDG CMPR 5X62 HK CLSR LF (GAUZE/BANDAGES/DRESSINGS) ×1
BNDG CMPR 6 X 5 YARDS HK CLSR (GAUZE/BANDAGES/DRESSINGS) ×1
BNDG CMPR 6"X 5 YARDS HK CLSR (GAUZE/BANDAGES/DRESSINGS) ×1
BNDG CMPR MED 10X6 ELC LF (GAUZE/BANDAGES/DRESSINGS) ×1
BNDG ELASTIC 6INX 5YD STR LF (GAUZE/BANDAGES/DRESSINGS) ×1 IMPLANT
BNDG ELASTIC 6X10 VLCR STRL LF (GAUZE/BANDAGES/DRESSINGS) IMPLANT
BOWL SMART MIX CTS (DISPOSABLE) ×1 IMPLANT
BSPLAT TIB 6 CMNT ROT PLAT STR (Knees) ×1 IMPLANT
CEMENT HV SMART SET (Cement) ×2 IMPLANT
COOLER ICEMAN CLASSIC (MISCELLANEOUS) IMPLANT
COVER SURGICAL LIGHT HANDLE (MISCELLANEOUS) ×1 IMPLANT
CUFF TOURN SGL QUICK 34 (TOURNIQUET CUFF) ×1
CUFF TRNQT CYL 34X4.125X (TOURNIQUET CUFF) ×1 IMPLANT
DERMABOND ADVANCED .7 DNX12 (GAUZE/BANDAGES/DRESSINGS) ×1 IMPLANT
DRAPE INCISE IOBAN 66X45 STRL (DRAPES) ×1 IMPLANT
DRAPE U-SHAPE 47X51 STRL (DRAPES) ×1 IMPLANT
DRSG AQUACEL AG ADV 3.5X10 (GAUZE/BANDAGES/DRESSINGS) ×1 IMPLANT
DURAPREP 26ML APPLICATOR (WOUND CARE) ×1 IMPLANT
ELECT REM PT RETURN 15FT ADLT (MISCELLANEOUS) ×1 IMPLANT
GLOVE BIO SURGEON STRL SZ 6.5 (GLOVE) IMPLANT
GLOVE BIO SURGEON STRL SZ8 (GLOVE) ×1 IMPLANT
GLOVE BIOGEL PI IND STRL 6.5 (GLOVE) IMPLANT
GLOVE BIOGEL PI IND STRL 7.0 (GLOVE) IMPLANT
GLOVE BIOGEL PI IND STRL 8 (GLOVE) ×1 IMPLANT
GOWN STRL REUS W/ TWL LRG LVL3 (GOWN DISPOSABLE) ×1 IMPLANT
GOWN STRL REUS W/TWL LRG LVL3 (GOWN DISPOSABLE) ×1
HANDPIECE INTERPULSE COAX TIP (DISPOSABLE) ×1
HOLDER FOLEY CATH W/STRAP (MISCELLANEOUS) IMPLANT
IMMOBILIZER KNEE 20 (SOFTGOODS) ×1
IMMOBILIZER KNEE 20 THIGH 36 (SOFTGOODS) ×1 IMPLANT
KIT TURNOVER KIT A (KITS) IMPLANT
MANIFOLD NEPTUNE II (INSTRUMENTS) ×1 IMPLANT
NS IRRIG 1000ML POUR BTL (IV SOLUTION) ×1 IMPLANT
PACK TOTAL KNEE CUSTOM (KITS) ×1 IMPLANT
PADDING CAST COTTON 6X4 STRL (CAST SUPPLIES) ×2 IMPLANT
PIN STEINMAN FIXATION KNEE (PIN) IMPLANT
PROTECTOR NERVE ULNAR (MISCELLANEOUS) ×1 IMPLANT
SET HNDPC FAN SPRY TIP SCT (DISPOSABLE) ×1 IMPLANT
SPIKE FLUID TRANSFER (MISCELLANEOUS) ×1 IMPLANT
SUT MNCRL AB 4-0 PS2 18 (SUTURE) ×1 IMPLANT
SUT STRATAFIX 0 PDS 27 VIOLET (SUTURE) ×1
SUT VIC AB 2-0 CT1 27 (SUTURE) ×3
SUT VIC AB 2-0 CT1 TAPERPNT 27 (SUTURE) ×3 IMPLANT
SUTURE STRATFX 0 PDS 27 VIOLET (SUTURE) ×1 IMPLANT
TIBIA ATTUNE KNEE SYS BASE SZ6 (Knees) ×1 IMPLANT
TRAY FOLEY MTR SLVR 16FR STAT (SET/KITS/TRAYS/PACK) ×1 IMPLANT
TUBE SUCTION HIGH CAP CLEAR NV (SUCTIONS) ×1 IMPLANT
WATER STERILE IRR 1000ML POUR (IV SOLUTION) ×2 IMPLANT
WRAP KNEE MAXI GEL POST OP (GAUZE/BANDAGES/DRESSINGS) ×1 IMPLANT

## 2022-08-19 NOTE — Anesthesia Postprocedure Evaluation (Signed)
Anesthesia Post Note  Patient: Nathaniel Luna  Procedure(s) Performed: TOTAL KNEE ARTHROPLASTY (Left: Knee)     Patient location during evaluation: PACU Anesthesia Type: Spinal and General Level of consciousness: awake and alert Pain management: pain level controlled Vital Signs Assessment: post-procedure vital signs reviewed and stable Respiratory status: spontaneous breathing, nonlabored ventilation, respiratory function stable and patient connected to nasal cannula oxygen Cardiovascular status: blood pressure returned to baseline and stable Postop Assessment: no apparent nausea or vomiting, no headache, spinal receding and no backache Anesthetic complications: no Comments: Partial spinal, requiring general anesthetic due to block not fully setting up   No notable events documented.  Last Vitals:  Vitals:   08/19/22 1130 08/19/22 1145  BP: 104/66 109/62  Pulse: 69 70  Resp: 14 18  Temp:    SpO2: 99% 99%    Last Pain:  Vitals:   08/19/22 1145  TempSrc:   PainSc: 3                  Beryle Lathe

## 2022-08-19 NOTE — Op Note (Signed)
OPERATIVE REPORT-TOTAL KNEE ARTHROPLASTY   Pre-operative diagnosis- Osteoarthritis  Left knee(s)  Post-operative diagnosis- Osteoarthritis Left knee(s)  Procedure-  Left  Total Knee Arthroplasty  Surgeon- Nathaniel Rankin. Sabra Sessler, MD  Assistant- Arther Abbott, PA-C   Anesthesia-   Adductor canal block and spinal  EBL-75 mL   Drains None  Tourniquet time-  Total Tourniquet Time Documented: Thigh (Left) - 42 minutes Total: Thigh (Left) - 42 minutes     Complications- None  Condition-PACU - hemodynamically stable.   Brief Clinical Note   Nathaniel Luna is a 57 y.o. year old male with end stage OA of his left knee with progressively worsening pain and dysfunction. He has constant pain, with activity and at rest and significant functional deficits with difficulties even with ADLs. He has had extensive non-op management including analgesics, injections of cortisone and viscosupplements, and home exercise program, but remains in significant pain with significant dysfunction. Radiographs show bone on bone arthritis medial and patellofemoral. He presents now for left Total Knee Arthroplasty.     Procedure in detail---   The patient is brought into the operating room and positioned supine on the operating table. After successful administration of  Adductor canal block and spinal,   a tourniquet is placed high on the  Left thigh(s) and the lower extremity is prepped and draped in the usual sterile fashion. Time out is performed by the operating team and then the  Left lower extremity is wrapped in Esmarch, knee flexed and the tourniquet inflated to 300 mmHg.       A midline incision is made with a ten blade through the subcutaneous tissue to the level of the extensor mechanism. A fresh blade is used to make a medial parapatellar arthrotomy. Soft tissue over the proximal medial tibia is subperiosteally elevated to the joint line with a knife and into the semimembranosus bursa with a Cobb  elevator. Soft tissue over the proximal lateral tibia is elevated with attention being paid to avoiding the patellar tendon on the tibial tubercle. The patella is everted, knee flexed 90 degrees and the ACL and PCL are removed. Findings are bone on bone medial and patellofemoral with large global osteophytes.        The drill is used to create a starting hole in the distal femur and the canal is thoroughly irrigated with sterile saline to remove the fatty contents. The 5 degree Left  valgus alignment guide is placed into the femoral canal and the distal femoral cutting block is pinned to remove 9 mm off the distal femur. Resection is made with an oscillating saw.      The tibia is subluxed forward and the menisci are removed. The extramedullary alignment guide is placed referencing proximally at the medial aspect of the tibial tubercle and distally along the second metatarsal axis and tibial crest. The block is pinned to remove 2mm off the more deficient medial  side. Resection is made with an oscillating saw. Size 6is the most appropriate size for the tibia and the proximal tibia is prepared with the modular drill and keel punch for that size.      The femoral sizing guide is placed and size 6 is most appropriate. Rotation is marked off the epicondylar axis and confirmed by creating a rectangular flexion gap at 90 degrees. The size 6 cutting block is pinned in this rotation and the anterior, posterior and chamfer cuts are made with the oscillating saw. The intercondylar block is then placed and that cut  is made.      Trial size 6 tibial component, trial size 6 posterior stabilized femur and a 10  mm posterior stabilized rotating platform insert trial is placed. Full extension is achieved with excellent varus/valgus and anterior/posterior balance throughout full range of motion. The patella is everted and thickness measured to be 24  mm. Free hand resection is taken to 14 mm, a 38 template is placed, lug holes  are drilled, trial patella is placed, and it tracks normally. Osteophytes are removed off the posterior femur with the trial in place. All trials are removed and the cut bone surfaces prepared with pulsatile lavage. Cement is mixed and once ready for implantation, the size 6 tibial implant, size  6 posterior stabilized femoral component, and the size 38 patella are cemented in place and the patella is held with the clamp. The trial insert is placed and the knee held in full extension. The Exparel (20 ml mixed with 60 ml saline) is injected into the extensor mechanism, posterior capsule, medial and lateral gutters and subcutaneous tissues.  All extruded cement is removed and once the cement is hard the permanent 10 mm posterior stabilized rotating platform insert is placed into the tibial tray.      The wound is copiously irrigated with saline solution and the extensor mechanism closed with # 0 Stratofix suture. The tourniquet is released for a total tourniquet time of 42  minutes. Flexion against gravity is 130 degrees and the patella tracks normally. Subcutaneous tissue is closed with 2.0 vicryl and subcuticular with running 4.0 Monocryl. The incision is cleaned and dried and steri-strips and a bulky sterile dressing are applied. The limb is placed into a knee immobilizer and the patient is awakened and transported to recovery in stable condition.      Please note that a surgical assistant was a medical necessity for this procedure in order to perform it in a safe and expeditious manner. Surgical assistant was necessary to retract the ligaments and vital neurovascular structures to prevent injury to them and also necessary for proper positioning of the limb to allow for anatomic placement of the prosthesis.   Nathaniel Rankin [name], MD    08/19/2022, 9:44 AM

## 2022-08-19 NOTE — Anesthesia Procedure Notes (Signed)
Spinal  Patient location during procedure: OR Start time: 08/19/2022 8:29 AM End time: 08/19/2022 8:32 AM Reason for block: surgical anesthesia Staffing Performed: anesthesiologist  Anesthesiologist: Beryle Lathe, MD Performed by: Beryle Lathe, MD Authorized by: Beryle Lathe, MD   Preanesthetic Checklist Completed: patient identified, IV checked, risks and benefits discussed, surgical consent, monitors and equipment checked, pre-op evaluation and timeout performed Spinal Block Patient position: sitting Prep: DuraPrep Patient monitoring: heart rate, cardiac monitor, continuous pulse ox and blood pressure Approach: midline Location: L3-4 Injection technique: single-shot Needle Needle type: Pencan  Needle gauge: 24 G Additional Notes Consent was obtained prior to the procedure with all questions answered and concerns addressed. Risks including, but not limited to, bleeding, infection, nerve damage, paralysis, failed block, inadequate analgesia, allergic reaction, high spinal, itching, and headache were discussed and the patient wished to proceed. Functioning IV was confirmed and monitors were applied. Sterile prep and drape, including hand hygiene, mask, and sterile gloves were used. The patient was positioned and the spine was prepped. The skin was anesthetized with lidocaine. Free flow of clear CSF was obtained prior to injecting local anesthetic into the CSF. The spinal needle aspirated freely following injection. The needle was carefully withdrawn. The patient tolerated the procedure well.   Leslye Peer, MD

## 2022-08-19 NOTE — Progress Notes (Signed)
Orthopedic Tech Progress Note Patient Details:  Nathaniel Luna 04-08-1965 [id number] CPM was taken off at 2:25 pm.  CPM Left Knee CPM Left Knee: Off Left Knee Flexion (Degrees): 40 Left Knee Extension (Degrees): 10     [name] [name] 08/19/2022, 2:48 PM

## 2022-08-19 NOTE — Interval H&P Note (Signed)
History and Physical Interval Note:  08/19/2022 6:30 AM  Nathaniel Luna  has presented today for surgery, with the diagnosis of left knee ostearthritis.  The various methods of treatment have been discussed with the patient and family. After consideration of risks, benefits and other options for treatment, the patient has consented to  Procedure(s): TOTAL KNEE ARTHROPLASTY (Left) as a surgical intervention.  The patient's history has been reviewed, patient examined, no change in status, stable for surgery.  I have reviewed the patient's chart and labs.  Questions were answered to the patient's satisfaction.     [name]

## 2022-08-19 NOTE — Anesthesia Procedure Notes (Signed)
Anesthesia Regional Block: Adductor canal block   Pre-Anesthetic Checklist: , timeout performed,  Correct Patient, Correct Site, Correct Laterality,  Correct Procedure, Correct Position, site marked,  Risks and benefits discussed,  Surgical consent,  Pre-op evaluation,  At surgeon's request and post-op pain management  Laterality: Left  Prep: chloraprep       Needles:  Injection technique: Single-shot  Needle Type: Echogenic Needle     Needle Length: 10cm  Needle Gauge: 21     Additional Needles:   Narrative:  Start time: 08/19/2022 7:51 AM End time: 08/19/2022 7:54 AM Injection made incrementally with aspirations every 5 mL.  Performed by: Personally  Anesthesiologist: Beryle Lathe, MD  Additional Notes: No pain on injection. No increased resistance to injection. Injection made in 5cc increments. Good needle visualization. Patient tolerated the procedure well.

## 2022-08-19 NOTE — Transfer of Care (Signed)
Immediate Anesthesia Transfer of Care Note  Patient: MARQUAL MI  Procedure(s) Performed: TOTAL KNEE ARTHROPLASTY (Left: Knee)  Patient Location: PACU  Anesthesia Type:GA combined with regional for post-op pain  Level of Consciousness: awake and alert   Airway & Oxygen Therapy: Patient Spontanous Breathing and Patient connected to face mask oxygen  Post-op Assessment: Report given to RN and Post -op Vital signs reviewed and stable  Post vital signs: Reviewed and stable  Last Vitals:  Vitals Value Taken Time  BP    Temp    Pulse 70 08/19/22 1010  Resp 17 08/19/22 1010  SpO2 100 % 08/19/22 1010  Vitals shown include unfiled device data.  Last Pain:  Vitals:   08/19/22 0810  TempSrc:   PainSc: (P) 0-No pain         Complications: No notable events documented.

## 2022-08-19 NOTE — Evaluation (Signed)
Physical Therapy Evaluation Patient Details Name: Nathaniel Luna MRN: [id number] DOB: 1965/11/18 Today's Date: 08/19/2022  History of Present Illness  57 yo male s/p L TKA on 08/19/22. PMH: R TKA, gout, OA, DM  Clinical Impression  Pt is s/p TKA resulting in the deficits listed below (see PT Problem List).  pt able to stand with 2 assist and  take lateral steps along EOB with heavy reliance on UEs.  amb deferred for pt safety d/t residual effects of anesthesia (pt reporting numbness bil feet and LEs feeling like "noodles") anticipate steady progress once residual effects of anesthesia resolved. (Pt ~ 6 hrs post spinal end time at time of PT eval)  Pt will benefit from acute skilled PT to increase their independence and safety with mobility to allow discharge.          Assistance Recommended at Discharge Intermittent Supervision/Assistance  If plan is discharge home, recommend the following:  Can travel by private vehicle  A little help with walking and/or transfers;Help with stairs or ramp for entrance;A little help with bathing/dressing/bathroom;Assistance with cooking/housework;Assist for transportation        Equipment Recommendations None recommended by PT  Recommendations for Other Services       Functional Status Assessment Patient has had a recent decline in their functional status and demonstrates the ability to make significant improvements in function in a reasonable and predictable amount of time.     Precautions / Restrictions Precautions Precautions: Fall;Knee Required Braces or Orthoses: Knee Immobilizer - Left Knee Immobilizer - Left: Discontinue once straight leg raise with < 10 degree lag Restrictions Weight Bearing Restrictions: No      Mobility  Bed Mobility Overal bed mobility: Needs Assistance Bed Mobility: Supine to Sit, Sit to Supine     Supine to sit: Min assist Sit to supine: Min assist   General bed mobility comments: assist with LLE     Transfers Overall transfer level: Needs assistance Equipment used: Rolling walker (2 wheels) Transfers: Sit to/from Stand Sit to Stand: Min assist, +2 safety/equipment, +2 physical assistance           General transfer comment: pt reporting diminished sensation bil feet and LEs feeling like "noodles"; assisted to stand and pt able to take lateral steps along EOB with heavy reliance on UEs. amb deferred for pt safety d/t residual effects of anesthesia    Ambulation/Gait                  Stairs            Wheelchair Mobility     Tilt Bed    Modified Rankin (Stroke Patients Only)       Balance Overall balance assessment: Needs assistance Sitting-balance support: Feet supported, No upper extremity supported Sitting balance-Leahy Scale: Good     Standing balance support: During functional activity, Reliant on assistive device for balance Standing balance-Leahy Scale: Poor                               Pertinent Vitals/Pain Pain Assessment Pain Assessment: 0-10 Pain Score: 5  Pain Location: left knee Pain Descriptors / Indicators: Discomfort, Aching Pain Intervention(s): Limited activity within patient's tolerance, Monitored during session, Premedicated before session, Repositioned    Home Living Family/patient expects to be discharged to:: Private residence Living Arrangements: Alone Available Help at Discharge: Family Type of Home: Apartment Home Access: Level entry       Home  Layout: One level Home Equipment: Agricultural consultant (2 wheels)      Prior Function Prior Level of Function : Independent/Modified Independent                     Hand Dominance        Extremity/Trunk Assessment   Upper Extremity Assessment Upper Extremity Assessment: Overall WFL for tasks assessed    Lower Extremity Assessment Lower Extremity Assessment: LLE deficits/detail LLE Deficits / Details: ankle WFL, knee extension and hip flexion  2/5, 25 degree lag when assisted with SLR       Communication   Communication: No difficulties  Cognition Arousal/Alertness: Awake/alert Behavior During Therapy: WFL for tasks assessed/performed Overall Cognitive Status: Within Functional Limits for tasks assessed                                          General Comments      Exercises Total Joint Exercises Ankle Circles/Pumps: AROM, Both, 5 reps   Assessment/Plan    PT Assessment Patient needs continued PT services  PT Problem List Decreased strength;Decreased range of motion;Decreased activity tolerance;Decreased balance;Decreased mobility;Decreased knowledge of precautions;Pain;Decreased knowledge of use of DME       PT Treatment Interventions DME instruction;Therapeutic exercise;Gait training;Functional mobility training;Therapeutic activities;Patient/family education    PT Goals (Current goals can be found in the Care Plan section)  Acute Rehab PT Goals PT Goal Formulation: With patient Time For Goal Achievement: 08/26/22 Potential to Achieve Goals: Good    Frequency 7X/week     Co-evaluation               AM-PAC PT "6 Clicks" Mobility  Outcome Measure Help needed turning from your back to your side while in a flat bed without using bedrails?: A Little Help needed moving from lying on your back to sitting on the side of a flat bed without using bedrails?: A Little Help needed moving to and from a bed to a chair (including a wheelchair)?: A Lot Help needed standing up from a chair using your arms (e.g., wheelchair or bedside chair)?: A Lot Help needed to walk in hospital room?: Total Help needed climbing 3-5 steps with a railing? : Total 6 Click Score: 12    End of Session Equipment Utilized During Treatment: Gait belt;Left knee immobilizer Activity Tolerance: Other (comment) (residual effects anesthesia)     PT Visit Diagnosis: Other abnormalities of gait and mobility  (R26.89);Difficulty in walking, not elsewhere classified (R26.2)    Time: 8841-6606 PT Time Calculation (min) (ACUTE ONLY): 32 min   Charges:   PT Evaluation $PT Eval Low Complexity: 1 Low PT Treatments $Therapeutic Activity: 8-22 mins PT General Charges $$ ACUTE PT VISIT: 1 Visit         Rick Warnick, PT  Acute Rehab Dept [facility]) [phone]  08/19/2022   [facility] 08/19/2022, 4:50 PM

## 2022-08-19 NOTE — Discharge Instructions (Addendum)
 Nathaniel Aluisio, MD Total Joint Specialist [facility] [place] [address] [place], [place] [address] [phone]  TOTAL KNEE REPLACEMENT POSTOPERATIVE DIRECTIONS    Knee Rehabilitation, Guidelines Following Surgery  Results after knee surgery are often greatly improved when you follow the exercise, range of motion and muscle strengthening exercises prescribed by your doctor. Safety measures are also important to protect the knee from further injury. If any of these exercises cause you to have increased pain or swelling in your knee joint, decrease the amount until you are comfortable again and slowly increase them. If you have problems or questions, call your caregiver or physical therapist for advice.   BLOOD CLOT PREVENTION Take 81 mg Aspirin two times a day for three weeks following surgery. Then take an 81 mg Aspirin once a day for three weeks. Then discontinue Aspirin. You may resume your vitamins/supplements upon discharge from the hospital. Do not take any NSAIDs (Advil, Aleve, Ibuprofen, Meloxicam, etc.) for 3 weeks, while taking 81mg Aspirin twice a day.   HOME CARE INSTRUCTIONS  Remove items at home which could result in a fall. This includes throw rugs or furniture in walking pathways.  ICE to the affected knee as much as tolerated. Icing helps control swelling. If the swelling is well controlled you will be more comfortable and rehab easier. Continue to use ice on the knee for pain and swelling from surgery. You may notice swelling that will progress down to the foot and ankle. This is normal after surgery. Elevate the leg when you are not up walking on it.    Continue to use the breathing machine which will help keep your temperature down. It is common for your temperature to cycle up and down following surgery, especially at night when you are not up moving around and exerting yourself. The breathing machine keeps your lungs expanded and your temperature  down. Do not place pillow under the operative knee, focus on keeping the knee straight while resting  DIET You may resume your previous home diet once you are discharged from the hospital.  DRESSING / WOUND CARE / SHOWERING Keep your bulky bandage on for 2 days. On the third post-operative day you may remove the Ace bandage and gauze. There is a waterproof adhesive bandage on your skin which will stay in place until your first follow-up appointment. Once you remove this you will not need to place another bandage You may begin showering 3 days following surgery, but do not submerge the incision under water.  ACTIVITY For the first 5 days, the key is rest and control of pain and swelling Do your home exercises twice a day starting on post-operative day 3. On the days you go to physical therapy, just do the home exercises once that day. You should rest, ice and elevate the leg for 50 minutes out of every hour. Get up and walk/stretch for 10 minutes per hour. After 5 days you can increase your activity slowly as tolerated. Walk with your walker as instructed. Use the walker until you are comfortable transitioning to a cane. Walk with the cane in the opposite hand of the operative leg. You may discontinue the cane once you are comfortable and walking steadily. Avoid periods of inactivity such as sitting longer than an hour when not asleep. This helps prevent blood clots.  You may discontinue the knee immobilizer once you are able to perform a straight leg raise while lying down. You may resume a sexual relationship in   one month or when given the OK by your doctor.  You may return to work once you are cleared by your doctor.  Do not drive a car for 6 weeks or until released by your surgeon.  Do not drive while taking narcotics.  TED HOSE STOCKINGS Wear the elastic stockings on both legs for three weeks following surgery during the day. You may remove them at night for sleeping.  WEIGHT  BEARING Weight bearing as tolerated with assist device (walker, cane, etc) as directed, use it as long as suggested by your surgeon or therapist, typically at least 4-6 weeks.  POSTOPERATIVE CONSTIPATION PROTOCOL Constipation - defined medically as fewer than three stools per week and severe constipation as less than one stool per week.  One of the most common issues patients have following surgery is constipation.  Even if you have a regular bowel pattern at home, your normal regimen is likely to be disrupted due to multiple reasons following surgery.  Combination of anesthesia, postoperative narcotics, change in appetite and fluid intake all can affect your bowels.  In order to avoid complications following surgery, here are some recommendations in order to help you during your recovery period.  Colace (docusate) - Pick up an over-the-counter form of Colace or another stool softener and take twice a day as long as you are requiring postoperative pain medications.  Take with a full glass of water daily.  If you experience loose stools or diarrhea, hold the colace until you stool forms back up. If your symptoms do not get better within 1 week or if they get worse, check with your doctor. Dulcolax (bisacodyl) - Pick up over-the-counter and take as directed by the product packaging as needed to assist with the movement of your bowels.  Take with a full glass of water.  Use this product as needed if not relieved by Colace only.  MiraLax (polyethylene glycol) - Pick up over-the-counter to have on hand. MiraLax is a solution that will increase the amount of water in your bowels to assist with bowel movements.  Take as directed and can mix with a glass of water, juice, soda, coffee, or tea. Take if you go more than two days without a movement. Do not use MiraLax more than once per day. Call your doctor if you are still constipated or irregular after using this medication for 7 days in a row.  If you continue  to have problems with postoperative constipation, please contact the office for further assistance and recommendations.  If you experience "the worst abdominal pain ever" or develop nausea or vomiting, please contact the office immediatly for further recommendations for treatment.  ITCHING If you experience itching with your medications, try taking only a single pain pill, or even half a pain pill at a time.  You can also use Benadryl over the counter for itching or also to help with sleep.   MEDICATIONS See your medication summary on the "After Visit Summary" that the nursing staff will review with you prior to discharge.  You may have some home medications which will be placed on hold until you complete the course of blood thinner medication.  It is important for you to complete the blood thinner medication as prescribed by your surgeon.  Continue your approved medications as instructed at time of discharge.  PRECAUTIONS If you experience chest pain or shortness of breath - call 911 immediately for transfer to the hospital emergency department.  If you develop a fever greater that   101 F, purulent drainage from wound, increased redness or drainage from wound, foul odor from the wound/dressing, or calf pain - CONTACT YOUR SURGEON.                                                   FOLLOW-UP APPOINTMENTS Make sure you keep all of your appointments after your operation with your surgeon and caregivers. You should call the office at the above phone number and make an appointment for approximately two weeks after the date of your surgery or on the date instructed by your surgeon outlined in the "After Visit Summary".  RANGE OF MOTION AND STRENGTHENING EXERCISES  Rehabilitation of the knee is important following a knee injury or an operation. After just a few days of immobilization, the muscles of the thigh which control the knee become weakened and shrink (atrophy). Knee exercises are designed to build up  the tone and strength of the thigh muscles and to improve knee motion. Often times heat used for twenty to thirty minutes before working out will loosen up your tissues and help with improving the range of motion but do not use heat for the first two weeks following surgery. These exercises can be done on a training (exercise) mat, on the floor, on a table or on a bed. Use what ever works the best and is most comfortable for you Knee exercises include:  Leg Lifts - While your knee is still immobilized in a splint or cast, you can do straight leg raises. Lift the leg to 60 degrees, hold for 3 sec, and slowly lower the leg. Repeat 10-20 times 2-3 times daily. Perform this exercise against resistance later as your knee gets better.  Quad and Hamstring Sets - Tighten up the muscle on the front of the thigh (Quad) and hold for 5-10 sec. Repeat this 10-20 times hourly. Hamstring sets are done by pushing the foot backward against an object and holding for 5-10 sec. Repeat as with quad sets.  Leg Slides: Lying on your back, slowly slide your foot toward your buttocks, bending your knee up off the floor (only go as far as is comfortable). Then slowly slide your foot back down until your leg is flat on the floor again. Angel Wings: Lying on your back spread your legs to the side as far apart as you can without causing discomfort.  A rehabilitation program following serious knee injuries can speed recovery and prevent re-injury in the future due to weakened muscles. Contact your doctor or a physical therapist for more information on knee rehabilitation.   POST-OPERATIVE OPIOID TAPER INSTRUCTIONS: It is important to wean off of your opioid medication as soon as possible. If you do not need pain medication after your surgery it is ok to stop day one. Opioids include: Codeine, Hydrocodone(Norco, Vicodin), Oxycodone(Percocet, oxycontin) and hydromorphone amongst others.  Long term and even short term use of opiods can  cause: Increased pain response Dependence Constipation Depression Respiratory depression And more.  Withdrawal symptoms can include Flu like symptoms Nausea, vomiting And more Techniques to manage these symptoms Hydrate well Eat regular healthy meals Stay active Use relaxation techniques(deep breathing, meditating, yoga) Do Not substitute Alcohol to help with tapering If you have been on opioids for less than two weeks and do not have pain than it is ok to stop all together.    Plan to wean off of opioids This plan should start within one week post op of your joint replacement. Maintain the same interval or time between taking each dose and first decrease the dose.  Cut the total daily intake of opioids by one tablet each day Next start to increase the time between doses. The last dose that should be eliminated is the evening dose.   IF YOU ARE TRANSFERRED TO A SKILLED REHAB FACILITY If the patient is transferred to a skilled rehab facility following release from the hospital, a list of the current medications will be sent to the facility for the patient to continue.  When discharged from the skilled rehab facility, please have the facility set up the patient's Home Health Physical Therapy prior to being released. Also, the skilled facility will be responsible for providing the patient with their medications at time of release from the facility to include their pain medication, the muscle relaxants, and their blood thinner medication. If the patient is still at the rehab facility at time of the two week follow up appointment, the skilled rehab facility will also need to assist the patient in arranging follow up appointment in our office and any transportation needs.  MAKE SURE YOU:  Understand these instructions.  Get help right away if you are not doing well or get worse.   DENTAL ANTIBIOTICS:  In most cases prophylactic antibiotics for Dental procdeures after total joint surgery are  not necessary.  Exceptions are as follows:  1. History of prior total joint infection  2. Severely immunocompromised (Organ Transplant, cancer chemotherapy, Rheumatoid biologic meds such as Humera)  3. Poorly controlled diabetes (A1C &gt; 8.0, blood glucose over 200)  If you have one of these conditions, contact your surgeon for an antibiotic prescription, prior to your dental procedure.    Pick up stool softner and laxative for home use following surgery while on pain medications. Do not submerge incision under water. Please use good hand washing techniques while changing dressing each day. May shower starting three days after surgery. Please use a clean towel to pat the incision dry following showers. Continue to use ice for pain and swelling after surgery. Do not use any lotions or creams on the incision until instructed by your surgeon.  

## 2022-08-19 NOTE — Progress Notes (Signed)
Orthopedic Tech Progress Note Patient Details:  Nathaniel Luna 11/21/1965 [id number] CPM was applied at 10:20 in PACU.  CPM Left Knee CPM Left Knee: On Left Knee Flexion (Degrees): 40 Left Knee Extension (Degrees): 10  Post Interventions Patient Tolerated: Well  [name] 08/19/2022, 2:52 PM

## 2022-08-19 NOTE — Anesthesia Procedure Notes (Signed)
Procedure Name: LMA Insertion Date/Time: 08/19/2022 9:00 AM  Performed by: Deri Fuelling, CRNAPre-anesthesia Checklist: Patient identified, Emergency Drugs available, Suction available, Patient being monitored and Timeout performed Patient Re-evaluated:Patient Re-evaluated prior to induction Oxygen Delivery Method: Circle system utilized Preoxygenation: Pre-oxygenation with 100% oxygen Induction Type: IV induction Ventilation: Mask ventilation without difficulty LMA: LMA inserted LMA Size: 5.0

## 2022-08-20 ENCOUNTER — Encounter (HOSPITAL_COMMUNITY): Payer: Self-pay | Admitting: Orthopedic Surgery

## 2022-08-20 DIAGNOSIS — Z803 Family history of malignant neoplasm of breast: Secondary | ICD-10-CM | POA: Diagnosis not present

## 2022-08-20 DIAGNOSIS — Z83719 Family history of colon polyps, unspecified: Secondary | ICD-10-CM | POA: Diagnosis not present

## 2022-08-20 DIAGNOSIS — Z79899 Other long term (current) drug therapy: Secondary | ICD-10-CM | POA: Diagnosis not present

## 2022-08-20 DIAGNOSIS — E7800 Pure hypercholesterolemia, unspecified: Secondary | ICD-10-CM | POA: Diagnosis present

## 2022-08-20 DIAGNOSIS — M1712 Unilateral primary osteoarthritis, left knee: Secondary | ICD-10-CM | POA: Diagnosis present

## 2022-08-20 DIAGNOSIS — Z8042 Family history of malignant neoplasm of prostate: Secondary | ICD-10-CM | POA: Diagnosis not present

## 2022-08-20 DIAGNOSIS — Z8261 Family history of arthritis: Secondary | ICD-10-CM | POA: Diagnosis not present

## 2022-08-20 DIAGNOSIS — Z7985 Long-term (current) use of injectable non-insulin antidiabetic drugs: Secondary | ICD-10-CM | POA: Diagnosis not present

## 2022-08-20 DIAGNOSIS — J45909 Unspecified asthma, uncomplicated: Secondary | ICD-10-CM | POA: Diagnosis present

## 2022-08-20 DIAGNOSIS — Z7984 Long term (current) use of oral hypoglycemic drugs: Secondary | ICD-10-CM | POA: Diagnosis not present

## 2022-08-20 DIAGNOSIS — Z96651 Presence of right artificial knee joint: Secondary | ICD-10-CM | POA: Diagnosis present

## 2022-08-20 DIAGNOSIS — H9191 Unspecified hearing loss, right ear: Secondary | ICD-10-CM | POA: Diagnosis present

## 2022-08-20 DIAGNOSIS — Z87442 Personal history of urinary calculi: Secondary | ICD-10-CM | POA: Diagnosis not present

## 2022-08-20 DIAGNOSIS — Z6837 Body mass index (BMI) 37.0-37.9, adult: Secondary | ICD-10-CM | POA: Diagnosis not present

## 2022-08-20 DIAGNOSIS — I2510 Atherosclerotic heart disease of native coronary artery without angina pectoris: Secondary | ICD-10-CM | POA: Diagnosis present

## 2022-08-20 DIAGNOSIS — E119 Type 2 diabetes mellitus without complications: Secondary | ICD-10-CM | POA: Diagnosis present

## 2022-08-20 DIAGNOSIS — K219 Gastro-esophageal reflux disease without esophagitis: Secondary | ICD-10-CM | POA: Diagnosis present

## 2022-08-20 DIAGNOSIS — Z885 Allergy status to narcotic agent status: Secondary | ICD-10-CM | POA: Diagnosis not present

## 2022-08-20 DIAGNOSIS — Z823 Family history of stroke: Secondary | ICD-10-CM | POA: Diagnosis not present

## 2022-08-20 LAB — HEMOGLOBIN A1C
Hgb A1c MFr Bld: 6.5 % — ABNORMAL HIGH (ref 4.8–5.6)
Mean Plasma Glucose: 140 mg/dL

## 2022-08-20 LAB — BASIC METABOLIC PANEL
Anion gap: 6 (ref 5–15)
BUN: 16 mg/dL (ref 6–20)
CO2: 26 mmol/L (ref 22–32)
Calcium: 7.8 mg/dL — ABNORMAL LOW (ref 8.9–10.3)
Chloride: 101 mmol/L (ref 98–111)
Creatinine, Ser: 0.94 mg/dL (ref 0.61–1.24)
GFR, Estimated: >60 mL/min (ref >60)
Glucose, Bld: 133 mg/dL — ABNORMAL HIGH (ref 70–99)
Potassium: 3.7 mmol/L (ref 3.5–5.1)
Sodium: 133 mmol/L — ABNORMAL LOW (ref 135–145)

## 2022-08-20 LAB — CBC
HCT: 37 % — ABNORMAL LOW (ref 39.0–52.0)
Hemoglobin: 12.1 g/dL — ABNORMAL LOW (ref 13.0–17.0)
MCH: 32.4 pg (ref 26.0–34.0)
MCHC: 32.7 g/dL (ref 30.0–36.0)
MCV: 99.2 fL (ref 80.0–100.0)
Platelets: 283 10*3/uL (ref 150–400)
RBC: 3.73 MIL/uL — ABNORMAL LOW (ref 4.22–5.81)
RDW: 13.3 % (ref 11.5–15.5)
WBC: 14.8 10*3/uL — ABNORMAL HIGH (ref 4.0–10.5)
nRBC: 0 % (ref 0.0–0.2)

## 2022-08-20 LAB — GLUCOSE, CAPILLARY
Glucose-Capillary: 167 mg/dL — ABNORMAL HIGH (ref 70–99)
Glucose-Capillary: 181 mg/dL — ABNORMAL HIGH (ref 70–99)
Glucose-Capillary: 241 mg/dL — ABNORMAL HIGH (ref 70–99)
Glucose-Capillary: 195 mg/dL — ABNORMAL HIGH (ref 70–99)

## 2022-08-20 MED ORDER — FAMOTIDINE 20 MG PO TABS
20.0000 mg | ORAL_TABLET | Freq: Every day | ORAL | Status: DC
  Administered 2022-08-20: 20 mg via ORAL
  Filled 2022-08-20: qty 1

## 2022-08-20 MED ORDER — ACETAMINOPHEN 500 MG PO TABS
500.0000 mg | ORAL_TABLET | Freq: Four times a day (QID) | ORAL | Status: DC | PRN
  Administered 2022-08-20 – 2022-08-21 (×3): 1000 mg via ORAL
  Filled 2022-08-20 (×3): qty 2

## 2022-08-20 MED ORDER — ATORVASTATIN CALCIUM 10 MG PO TABS
10.0000 mg | ORAL_TABLET | Freq: Every day | ORAL | Status: DC
  Administered 2022-08-20: 10 mg via ORAL
  Filled 2022-08-20: qty 1

## 2022-08-20 NOTE — Progress Notes (Signed)
Physical Therapy Treatment Patient Details Name: Nathaniel Luna MRN: [id number] DOB: 22-Jan-1966 Today's Date: 08/20/2022   History of Present Illness 57 yo male s/p L TKA on 08/19/22. PMH: R TKA, gout, OA, DM    PT Comments  Pt progressing however still having some issues with pain, fatigues easily. Will benefit from another day to work with PT for safe d/c home     Assistance Recommended at Discharge Intermittent Supervision/Assistance  If plan is discharge home, recommend the following:  Can travel by private vehicle    A little help with walking and/or transfers;Help with stairs or ramp for entrance;A little help with bathing/dressing/bathroom;Assistance with cooking/housework;Assist for transportation      Equipment Recommendations  None recommended by PT    Recommendations for Other Services       Precautions / Restrictions Precautions Precautions: Fall;Knee Required Braces or Orthoses: Knee Immobilizer - Left Knee Immobilizer - Left: Discontinue once straight leg raise with < 10 degree lag Restrictions Weight Bearing Restrictions: No Other Position/Activity Restrictions: WBAT     Mobility  Bed Mobility Overal bed mobility: Needs Assistance Bed Mobility: Supine to Sit     Supine to sit: Min assist     General bed mobility comments: assist with LLE    Transfers Overall transfer level: Needs assistance Equipment used: Rolling walker (2 wheels) Transfers: Sit to/from Stand Sit to Stand: From elevated surface, Min assist           General transfer comment: cues for hand placement and LLE position, pt pulls on RW    Ambulation/Gait Ambulation/Gait assistance: Min guard Gait Distance (Feet): 40 Feet Assistive device: Rolling walker (2 wheels) Gait Pattern/deviations: Step-to pattern       General Gait Details: cues for sequence, posture, RW position   Stairs             Wheelchair Mobility     Tilt Bed    Modified Rankin (Stroke  Patients Only)       Balance                                            Cognition Arousal/Alertness: Awake/alert Behavior During Therapy: WFL for tasks assessed/performed Overall Cognitive Status: Within Functional Limits for tasks assessed                                          Exercises Total Joint Exercises Heel Slides: AAROM, Left, 5 reps Straight Leg Raises: AAROM, Left, 5 reps    General Comments        Pertinent Vitals/Pain Pain Assessment Pain Assessment: 0-10 Pain Score: 6  Pain Location: left knee and thigh Pain Descriptors / Indicators: Discomfort, Sore Pain Intervention(s): Limited activity within patient's tolerance, Monitored during session, Premedicated before session, Repositioned    Home Living                          Prior Function            PT Goals (current goals can now be found in the care plan section) Acute Rehab PT Goals PT Goal Formulation: With patient Time For Goal Achievement: 08/26/22 Potential to Achieve Goals: Good Progress towards PT goals: Progressing toward goals    Frequency  7X/week      PT Plan Current plan remains appropriate    Co-evaluation              AM-PAC PT "6 Clicks" Mobility   Outcome Measure  Help needed turning from your back to your side while in a flat bed without using bedrails?: A Little Help needed moving from lying on your back to sitting on the side of a flat bed without using bedrails?: A Little Help needed moving to and from a bed to a chair (including a wheelchair)?: A Little Help needed standing up from a chair using your arms (e.g., wheelchair or bedside chair)?: A Little Help needed to walk in hospital room?: A Little Help needed climbing 3-5 steps with a railing? : A Lot 6 Click Score: 17    End of Session Equipment Utilized During Treatment: Gait belt;Left knee immobilizer Activity Tolerance: Patient limited by pain;Patient  limited by fatigue Patient left: in chair;with call bell/phone within reach;with chair alarm set   PT Visit Diagnosis: Other abnormalities of gait and mobility (R26.89);Difficulty in walking, not elsewhere classified (R26.2)     Time: 0981-1914 PT Time Calculation (min) (ACUTE ONLY): 28 min  Charges:    $Gait Training: 23-37 mins PT General Charges $$ ACUTE PT VISIT: 1 Visit                     Shanetra Blumenstock, PT  Acute Rehab Dept [facility]) [phone]  08/20/2022    [facility] 08/20/2022, 10:50 AM

## 2022-08-20 NOTE — TOC Transition Note (Signed)
Transition of Care [facility]) - CM/SW Discharge Note   Patient Details  Name: Nathaniel Luna MRN: [id number] Date of Birth: [birth year]  Transition of Care [facility]) CM/SW Contact:  Amada Jupiter, LCSW Phone Number: 08/20/2022, 10:49 AM   Clinical Narrative:     Met with pt who confirms he has needed DME in the home.  He is aware and agreeable with plan for dc with HHPT and requests [facility] again - referral placed/ accepted with [facility].  No further TOC needs.  Final next level of care: Home w Home Health Services Barriers to Discharge: No Barriers Identified   Patient Goals and CMS Choice      Discharge Placement                         Discharge Plan and Services Additional resources added to the After Visit Summary for                  DME Arranged: N/A DME Agency: NA       HH Arranged: PT HH Agency: [facility] Date [facility] Agency Contacted: 08/20/22 Time HH Agency Contacted: 1048 Representative spoke with at [facility] Agency: Becky Sax  Social Determinants of Health (SDOH) Interventions SDOH Screenings   Food Insecurity: No Food Insecurity (08/19/2022)  Housing: Low Risk  (08/19/2022)  Transportation Needs: No Transportation Needs (08/19/2022)  Utilities: Not At Risk (08/19/2022)  Tobacco Use: Low Risk  (08/19/2022)     Readmission Risk Interventions     No data to display
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
## 2022-08-20 NOTE — Progress Notes (Signed)
Orthopedic Tech Progress Note Patient Details:  Nathaniel Luna [birth year] [id number]  Patient ID: Nathaniel Luna, male   DOB: 11/22/1965, 57 y.o.   MRN: [id number]  Nathaniel Luna 08/20/2022, 8:49 PM Cpm applied. Flexion increased to 50

## 2022-08-20 NOTE — Progress Notes (Signed)
Physical Therapy Treatment Patient Details Name: Nathaniel Luna MRN: [id number] DOB: [birth year] Today's Date: 08/20/2022   History of Present Illness 57 yo male s/p L TKA on 08/19/22. PMH: R TKA, gout, OA, DM    PT Comments  Pt limited by pain, assisted back to bed with ice to knee and heat to upper thigh. Lengthy discussion regarding pt pain meds, requesting meds, timing of pain pills and muscle relaxer, RN gave meds during session. Continue in am, should be ready for d/c tomorrow if pain controlled     Assistance Recommended at Discharge Intermittent Supervision/Assistance  If plan is discharge home, recommend the following:  Can travel by private vehicle    A little help with walking and/or transfers;Help with stairs or ramp for entrance;A little help with bathing/dressing/bathroom;Assistance with cooking/housework;Assist for transportation      Equipment Recommendations  None recommended by PT    Recommendations for Other Services       Precautions / Restrictions Precautions Precautions: Fall;Knee Required Braces or Orthoses: Knee Immobilizer - Left Knee Immobilizer - Left: Discontinue once straight leg raise with < 10 degree lag Restrictions Weight Bearing Restrictions: No Other Position/Activity Restrictions: WBAT     Mobility  Bed Mobility Overal bed mobility: Needs Assistance Bed Mobility: Sit to Supine     Supine to sit: Min assist Sit to supine: Min assist   General bed mobility comments: assist with LLE    Transfers Overall transfer level: Needs assistance Equipment used: Rolling walker (2 wheels) Transfers: Sit to/from Stand, Bed to chair/wheelchair/BSC Sit to Stand: From elevated surface, Min assist           General transfer comment: cues for hand placement and LLE position, pt pulls on RW    Ambulation/Gait Ambulation/Gait assistance: Min guard Gait Distance (Feet): 40 Feet Assistive device: Rolling walker (2 wheels) Gait  Pattern/deviations: Step-to pattern       General Gait Details: cues for sequence, posture, RW position   Stairs             Wheelchair Mobility     Tilt Bed    Modified Rankin (Stroke Patients Only)       Balance                                            Cognition Arousal/Alertness: Awake/alert Behavior During Therapy: WFL for tasks assessed/performed Overall Cognitive Status: Within Functional Limits for tasks assessed                                          Exercises Limited by pain   General Comments        Pertinent Vitals/Pain Pain Assessment Pain Assessment: 0-10 Pain Score: 10-Worst pain ever Pain Location: left knee and thigh Pain Descriptors / Indicators: Discomfort, Sore, Grimacing, Guarding Pain Intervention(s): Limited activity within patient's tolerance, Monitored during session, Repositioned, Patient requesting pain meds-RN notified, RN gave pain meds during session, Ice applied, Heat applied    Home Living                          Prior Function            PT Goals (current goals can now be found in the care plan section)  Acute Rehab PT Goals PT Goal Formulation: With patient Time For Goal Achievement: 08/26/22 Potential to Achieve Goals: Good Progress towards PT goals: Progressing toward goals    Frequency    7X/week      PT Plan Current plan remains appropriate    Co-evaluation              AM-PAC PT "6 Clicks" Mobility   Outcome Measure  Help needed turning from your back to your side while in a flat bed without using bedrails?: A Little Help needed moving from lying on your back to sitting on the side of a flat bed without using bedrails?: A Little Help needed moving to and from a bed to a chair (including a wheelchair)?: A Little Help needed standing up from a chair using your arms (e.g., wheelchair or bedside chair)?: A Little Help needed to walk in hospital  room?: A Little Help needed climbing 3-5 steps with a railing? : A Lot 6 Click Score: 17    End of Session Equipment Utilized During Treatment: Gait belt;Left knee immobilizer Activity Tolerance: Patient limited by pain Patient left: in chair;with call bell/phone within reach;with chair alarm set   PT Visit Diagnosis: Other abnormalities of gait and mobility (R26.89);Difficulty in walking, not elsewhere classified (R26.2)     Time: 7829-5621 PT Time Calculation (min) (ACUTE ONLY): 45 min  Charges:    $Therapeutic Activity: 38-52 mins PT General Charges $$ ACUTE PT VISIT: 1 Visit                     Ayodeji Keimig, PT  Acute Rehab Dept [facility]) [phone]  08/20/2022    [facility] 08/20/2022, 4:52 PM

## 2022-08-21 ENCOUNTER — Other Ambulatory Visit (HOSPITAL_COMMUNITY): Payer: Self-pay

## 2022-08-21 LAB — CBC
HCT: 39.1 % (ref 39.0–52.0)
Hemoglobin: 12.6 g/dL — ABNORMAL LOW (ref 13.0–17.0)
MCH: 31.6 pg (ref 26.0–34.0)
MCHC: 32.2 g/dL (ref 30.0–36.0)
MCV: 98 fL (ref 80.0–100.0)
Platelets: 275 10*3/uL (ref 150–400)
RBC: 3.99 MIL/uL — ABNORMAL LOW (ref 4.22–5.81)
RDW: 13.3 % (ref 11.5–15.5)
WBC: 12.6 10*3/uL — ABNORMAL HIGH (ref 4.0–10.5)
nRBC: 0 % (ref 0.0–0.2)

## 2022-08-21 LAB — GLUCOSE, CAPILLARY: Glucose-Capillary: 157 mg/dL — ABNORMAL HIGH (ref 70–99)

## 2022-08-21 MED ORDER — ASPIRIN 81 MG PO CHEW
81.0000 mg | CHEWABLE_TABLET | Freq: Two times a day (BID) | ORAL | 0 refills | Status: AC
  Filled 2022-08-21: qty 63, 41d supply, fill #0

## 2022-08-21 MED ORDER — GABAPENTIN 300 MG PO CAPS
ORAL_CAPSULE | ORAL | 0 refills | Status: AC
  Filled 2022-08-21: qty 84, 84d supply, fill #0

## 2022-08-21 MED ORDER — TRAMADOL HCL 50 MG PO TABS
50.0000 mg | ORAL_TABLET | Freq: Four times a day (QID) | ORAL | 0 refills | Status: AC | PRN
  Filled 2022-08-21: qty 40, 5d supply, fill #0

## 2022-08-21 MED ORDER — OXYCODONE HCL 5 MG PO TABS
5.0000 mg | ORAL_TABLET | Freq: Four times a day (QID) | ORAL | 0 refills | Status: AC | PRN
  Filled 2022-08-21: qty 42, 6d supply, fill #0

## 2022-08-21 MED ORDER — METHOCARBAMOL 500 MG PO TABS
500.0000 mg | ORAL_TABLET | Freq: Four times a day (QID) | ORAL | 0 refills | Status: AC | PRN
  Filled 2022-08-21: qty 40, 10d supply, fill #0

## 2022-08-21 MED ORDER — ONDANSETRON HCL 4 MG PO TABS
4.0000 mg | ORAL_TABLET | Freq: Four times a day (QID) | ORAL | 0 refills | Status: AC | PRN
  Filled 2022-08-21: qty 20, 5d supply, fill #0

## 2022-08-21 NOTE — Progress Notes (Signed)
Physical Therapy Treatment Patient Details Name: Nathaniel Luna MRN: [id number] DOB: August 28, 1965 Today's Date: 08/21/2022   History of Present Illness 57 yo male s/p L TKA on 08/19/22. PMH: R TKA, gout, OA, DM    PT Comments  Pt meeting goals,  pain better controlled with mobility this am; pt has no steps at home and is familiar with HEP (handout reviewed) from his recent TKA surgery on the contralateral side.     Assistance Recommended at Discharge Intermittent Supervision/Assistance  If plan is discharge home, recommend the following:  Can travel by private vehicle    A little help with walking and/or transfers;Help with stairs or ramp for entrance;A little help with bathing/dressing/bathroom;Assistance with cooking/housework;Assist for transportation      Equipment Recommendations  None recommended by PT    Recommendations for Other Services       Precautions / Restrictions Precautions Precautions: Fall;Knee Required Braces or Orthoses: Knee Immobilizer - Left Knee Immobilizer - Left: Discontinue once straight leg raise with < 10 degree lag Restrictions Weight Bearing Restrictions: No Other Position/Activity Restrictions: WBAT     Mobility  Bed Mobility               General bed mobility comments: in recliner    Transfers Overall transfer level: Needs assistance Equipment used: Rolling walker (2 wheels) Transfers: Sit to/from Stand, Bed to chair/wheelchair/BSC Sit to Stand: Min assist           General transfer comment: cues for hand placement and LLE position; pt requires assist to lower LLE d/t pain    Ambulation/Gait Ambulation/Gait assistance: Min guard, Supervision Gait Distance (Feet): 65 Feet Assistive device: Rolling walker (2 wheels) Gait Pattern/deviations: Step-to pattern       General Gait Details: cues for iniital sequence; improved wt shift to LLE with pt reporting pain controlled during gait/no incr pain   Stairs              Wheelchair Mobility     Tilt Bed    Modified Rankin (Stroke Patients Only)       Balance                                            Cognition Arousal/Alertness: Awake/alert Behavior During Therapy: WFL for tasks assessed/performed Overall Cognitive Status: Within Functional Limits for tasks assessed                                          Exercises Total Joint Exercises Straight Leg Raises:  (reviewed HEP handout, pt verbalizes recall from previous surgery)    General Comments        Pertinent Vitals/Pain Pain Assessment Pain Assessment: Faces Faces Pain Scale: Hurts even more Pain Location: left knee and thigh Pain Descriptors / Indicators: Discomfort, Sore, Grimacing, Guarding Pain Intervention(s): Limited activity within patient's tolerance, Monitored during session, Premedicated before session, Repositioned, Heat applied (heat to thigh)    Home Living                          Prior Function            PT Goals (current goals can now be found in the care plan section) Acute Rehab PT Goals PT Goal Formulation:  With patient Time For Goal Achievement: 08/26/22 Potential to Achieve Goals: Good Progress towards PT goals: Progressing toward goals    Frequency    7X/week      PT Plan Current plan remains appropriate    Co-evaluation              AM-PAC PT "6 Clicks" Mobility   Outcome Measure  Help needed turning from your back to your side while in a flat bed without using bedrails?: A Little Help needed moving from lying on your back to sitting on the side of a flat bed without using bedrails?: A Little Help needed moving to and from a bed to a chair (including a wheelchair)?: A Little Help needed standing up from a chair using your arms (e.g., wheelchair or bedside chair)?: A Little Help needed to walk in hospital room?: A Little Help needed climbing 3-5 steps with a railing? : A Lot 6  Click Score: 17    End of Session Equipment Utilized During Treatment: Gait belt Activity Tolerance: Patient tolerated treatment well Patient left: in chair;with call bell/phone within reach (no alarm on arrival) Nurse Communication: Mobility status PT Visit Diagnosis: Other abnormalities of gait and mobility (R26.89);Difficulty in walking, not elsewhere classified (R26.2)     Time: 4401-0272 PT Time Calculation (min) (ACUTE ONLY): 21 min  Charges:    $Gait Training: 8-22 mins PT General Charges $$ ACUTE PT VISIT: 1 Visit                     Cyndia Degraff, PT  Acute Rehab Dept [facility]) [phone]  08/21/2022    [facility] 08/21/2022, 10:00 AM
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
## 2022-09-24 ENCOUNTER — Ambulatory Visit: Payer: BC Managed Care – PPO | Attending: Physician Assistant

## 2022-09-24 ENCOUNTER — Other Ambulatory Visit: Payer: Self-pay

## 2022-09-24 DIAGNOSIS — Z96652 Presence of left artificial knee joint: Secondary | ICD-10-CM | POA: Diagnosis present

## 2022-09-24 DIAGNOSIS — R262 Difficulty in walking, not elsewhere classified: Secondary | ICD-10-CM | POA: Diagnosis not present

## 2022-09-24 DIAGNOSIS — M25662 Stiffness of left knee, not elsewhere classified: Secondary | ICD-10-CM | POA: Insufficient documentation
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
## 2022-09-26 ENCOUNTER — Ambulatory Visit: Payer: BC Managed Care – PPO

## 2022-09-30 ENCOUNTER — Ambulatory Visit: Payer: BC Managed Care – PPO

## 2022-09-30 DIAGNOSIS — R262 Difficulty in walking, not elsewhere classified: Secondary | ICD-10-CM | POA: Diagnosis not present

## 2022-09-30 DIAGNOSIS — M25662 Stiffness of left knee, not elsewhere classified: Secondary | ICD-10-CM

## 2022-09-30 DIAGNOSIS — Z96652 Presence of left artificial knee joint: Secondary | ICD-10-CM
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
## 2022-10-03 ENCOUNTER — Ambulatory Visit: Payer: BC Managed Care – PPO | Admitting: Physical Therapy

## 2022-10-03 ENCOUNTER — Encounter: Payer: Self-pay | Admitting: Physical Therapy

## 2022-10-03 DIAGNOSIS — R262 Difficulty in walking, not elsewhere classified: Secondary | ICD-10-CM | POA: Diagnosis not present

## 2022-10-03 DIAGNOSIS — Z96652 Presence of left artificial knee joint: Secondary | ICD-10-CM

## 2022-10-03 DIAGNOSIS — M25662 Stiffness of left knee, not elsewhere classified: Secondary | ICD-10-CM
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
## 2022-10-08 ENCOUNTER — Ambulatory Visit: Payer: BC Managed Care – PPO | Admitting: Physical Therapy

## 2022-10-09 ENCOUNTER — Other Ambulatory Visit: Payer: Self-pay

## 2022-10-10 ENCOUNTER — Encounter: Payer: BC Managed Care – PPO | Admitting: Physical Therapy
